# Patient Record
Sex: Female | Born: 1935
Health system: Southern US, Community
[De-identification: ages and names within clinical notes are randomized; demographics above are authoritative.]

## PROBLEM LIST (undated history)

## (undated) DIAGNOSIS — Z9889 Other specified postprocedural states: Secondary | ICD-10-CM

## (undated) DIAGNOSIS — Z8709 Personal history of other diseases of the respiratory system: Secondary | ICD-10-CM

## (undated) DIAGNOSIS — J45909 Unspecified asthma, uncomplicated: Secondary | ICD-10-CM

## (undated) DIAGNOSIS — K5790 Diverticulosis of intestine, part unspecified, without perforation or abscess without bleeding: Secondary | ICD-10-CM

## (undated) DIAGNOSIS — R112 Nausea with vomiting, unspecified: Secondary | ICD-10-CM

## (undated) DIAGNOSIS — K219 Gastro-esophageal reflux disease without esophagitis: Secondary | ICD-10-CM

## (undated) DIAGNOSIS — J189 Pneumonia, unspecified organism: Secondary | ICD-10-CM

## (undated) DIAGNOSIS — M5430 Sciatica, unspecified side: Secondary | ICD-10-CM

## (undated) DIAGNOSIS — M069 Rheumatoid arthritis, unspecified: Secondary | ICD-10-CM

## (undated) DIAGNOSIS — Z8719 Personal history of other diseases of the digestive system: Secondary | ICD-10-CM

## (undated) HISTORY — PX: BREAST BIOPSY: SHX20

## (undated) HISTORY — PX: ABDOMINAL HYSTERECTOMY: SHX81

## (undated) HISTORY — PX: HERNIA REPAIR: SHX51

## (undated) HISTORY — PX: TONSILLECTOMY: SUR1361

## (undated) HISTORY — PX: CATARACT EXTRACTION: SUR2

---

## 1997-11-29 ENCOUNTER — Ambulatory Visit (HOSPITAL_COMMUNITY): Admission: RE | Admit: 1997-11-29 | Discharge: 1997-11-29 | Payer: Self-pay | Admitting: *Deleted

## 1999-08-24 ENCOUNTER — Encounter: Payer: Self-pay | Admitting: General Surgery

## 1999-08-25 ENCOUNTER — Ambulatory Visit (HOSPITAL_COMMUNITY): Admission: RE | Admit: 1999-08-25 | Discharge: 1999-08-25 | Payer: Self-pay | Admitting: General Surgery

## 1999-09-26 ENCOUNTER — Encounter: Admission: RE | Admit: 1999-09-26 | Discharge: 1999-09-26 | Payer: Self-pay | Admitting: *Deleted

## 1999-09-26 ENCOUNTER — Encounter: Payer: Self-pay | Admitting: *Deleted

## 2000-05-27 ENCOUNTER — Encounter: Admission: RE | Admit: 2000-05-27 | Discharge: 2000-05-27 | Payer: Self-pay | Admitting: Internal Medicine

## 2000-05-27 ENCOUNTER — Encounter: Payer: Self-pay | Admitting: Internal Medicine

## 2000-06-17 ENCOUNTER — Encounter: Payer: Self-pay | Admitting: Internal Medicine

## 2000-06-17 ENCOUNTER — Ambulatory Visit (HOSPITAL_COMMUNITY): Admission: RE | Admit: 2000-06-17 | Discharge: 2000-06-17 | Payer: Self-pay | Admitting: Internal Medicine

## 2003-06-21 ENCOUNTER — Encounter: Admission: RE | Admit: 2003-06-21 | Discharge: 2003-06-21 | Payer: Self-pay | Admitting: Internal Medicine

## 2005-07-30 ENCOUNTER — Encounter: Admission: RE | Admit: 2005-07-30 | Discharge: 2005-07-30 | Payer: Self-pay

## 2008-08-11 ENCOUNTER — Encounter: Admission: RE | Admit: 2008-08-11 | Discharge: 2008-08-11 | Payer: Self-pay | Admitting: Family Medicine

## 2009-08-12 ENCOUNTER — Ambulatory Visit (HOSPITAL_COMMUNITY): Admission: RE | Admit: 2009-08-12 | Discharge: 2009-08-12 | Payer: Self-pay | Admitting: Family Medicine

## 2010-03-12 ENCOUNTER — Encounter: Payer: Self-pay | Admitting: Internal Medicine

## 2010-07-04 ENCOUNTER — Other Ambulatory Visit: Payer: Self-pay | Admitting: Family Medicine

## 2010-07-04 DIAGNOSIS — Z780 Asymptomatic menopausal state: Secondary | ICD-10-CM

## 2010-07-04 DIAGNOSIS — Z1231 Encounter for screening mammogram for malignant neoplasm of breast: Secondary | ICD-10-CM

## 2010-07-07 NOTE — Op Note (Signed)
[facility]  Patient:    Cathy Martinez, Cathy Martinez                         MRN: [id number] Proc. Date: 08/25/99 Adm. Date:  [id number] Attending:  Tempie Donning CC:         Georg Ruddle. Viviann Spare, M.D.                           Operative Report  PREOPERATIVE DIAGNOSIS:  Left inguinal hernia.  POSTOPERATIVE DIAGNOSIS:  Left inguinal hernia.  OPERATION:  Repair of left inguinal hernia - indirect, with Prolene mesh plug and patch.  SURGEON:  Gita Kudo, M.D.  ANESTHESIA:  General  CLINICAL SUMMARY:  A 77 year old woman with enlarging somewhat painful bulge in her left groin and has physical examination showing left inguinal hernia.  OPERATIVE FINDINGS:  The patient has a medium sized left inguinal hernia that was easily reduced - indirect.  DESCRIPTION OF PROCEDURE:  Under satisfactory general anesthesia, having received 1.0 gm Ancef preoperative, the patients abdomen was prepped and draped in a standard fashion.  A transverse left lower abdominal incision made and carried down to and through external ring and external oblique.  Bleeders coagulated or tied with 3-0 Vicryl. Self retaining retractors gave excellent exposure and the hernia sac freed around to healthy margins, opened and then contents reduced.  The sac was closed with a 0 Prolene suture ligature and excess trimmed.  The floor of the canal was then opened from the pubis to the internal ring and finger dissection used to develop the preperitoneal space. A portion of a 3 x 6 inch piece of Prolene mesh was cut and tailored into a plug and then inserted into the defect.  It was tacked around the periphery times three from its edge to the edge of the fascial defect.  Then the floor was closed over this with a running suture of 0 Prolene completely closing the internal ring.  The ends of the suture were left long.  The remainder of the mesh was tailored into an oval to fit over the floor. It  was anchored at the repair with a previous suture and then tacked around the periphery with interrupted 0 Prolene to the internal oblique above and the inguinal ligament below.  Then the wound was lavaged with saline, infiltrated with Marcaine for postoperative analgesia and closed in layers. Running 2-0 Vicryl for external oblique, 2-0 Vicryl for deep fascia, 3-0 Vicryl for subcutaneous and Steri-Strips for skin. Sterile absorptive dressing was applied and the patient went to the recovery room from the operating room in good condition without complications. DD:  08/25/99 TD:  08/25/99 Job: [id number]

## 2010-08-14 ENCOUNTER — Ambulatory Visit
Admission: RE | Admit: 2010-08-14 | Discharge: 2010-08-14 | Disposition: A | Discharge status: Home / Self Care | Payer: Medicare HMO | Source: Ambulatory Visit | Attending: Family Medicine | Admitting: Family Medicine

## 2010-08-14 DIAGNOSIS — Z1231 Encounter for screening mammogram for malignant neoplasm of breast: Secondary | ICD-10-CM

## 2010-08-14 DIAGNOSIS — Z780 Asymptomatic menopausal state: Secondary | ICD-10-CM

## 2011-01-03 ENCOUNTER — Other Ambulatory Visit: Payer: Self-pay | Admitting: Family Medicine

## 2011-01-03 DIAGNOSIS — M549 Dorsalgia, unspecified: Secondary | ICD-10-CM

## 2011-01-04 ENCOUNTER — Ambulatory Visit
Admission: RE | Admit: 2011-01-04 | Discharge: 2011-01-04 | Disposition: A | Discharge status: Home / Self Care | Payer: Medicare HMO | Source: Ambulatory Visit | Attending: Family Medicine | Admitting: Family Medicine

## 2011-01-04 DIAGNOSIS — M549 Dorsalgia, unspecified: Secondary | ICD-10-CM

## 2011-07-26 ENCOUNTER — Other Ambulatory Visit (HOSPITAL_COMMUNITY): Payer: Self-pay | Admitting: Family Medicine

## 2011-07-26 DIAGNOSIS — Z1231 Encounter for screening mammogram for malignant neoplasm of breast: Secondary | ICD-10-CM

## 2011-08-20 ENCOUNTER — Ambulatory Visit (HOSPITAL_COMMUNITY)
Admission: RE | Admit: 2011-08-20 | Discharge: 2011-08-20 | Disposition: A | Discharge status: Home / Self Care | Payer: Medicare Other | Source: Ambulatory Visit | Attending: Family Medicine | Admitting: Family Medicine

## 2011-08-20 DIAGNOSIS — Z1231 Encounter for screening mammogram for malignant neoplasm of breast: Secondary | ICD-10-CM | POA: Insufficient documentation

## 2012-02-20 DIAGNOSIS — Z8719 Personal history of other diseases of the digestive system: Secondary | ICD-10-CM

## 2012-02-20 HISTORY — DX: Personal history of other diseases of the digestive system: Z87.19

## 2012-07-28 ENCOUNTER — Other Ambulatory Visit (HOSPITAL_COMMUNITY): Payer: Self-pay | Admitting: Family Medicine

## 2012-07-28 DIAGNOSIS — Z1231 Encounter for screening mammogram for malignant neoplasm of breast: Secondary | ICD-10-CM

## 2012-08-20 ENCOUNTER — Ambulatory Visit (HOSPITAL_COMMUNITY)
Admission: RE | Admit: 2012-08-20 | Discharge: 2012-08-20 | Disposition: A | Discharge status: Home / Self Care | Payer: Medicare Other | Source: Ambulatory Visit | Attending: Family Medicine | Admitting: Family Medicine

## 2012-08-20 DIAGNOSIS — Z1231 Encounter for screening mammogram for malignant neoplasm of breast: Secondary | ICD-10-CM | POA: Insufficient documentation

## 2013-02-01 ENCOUNTER — Emergency Department (HOSPITAL_COMMUNITY): Payer: Medicare Other

## 2013-02-01 ENCOUNTER — Encounter (HOSPITAL_COMMUNITY): Payer: Self-pay | Admitting: Emergency Medicine

## 2013-02-01 ENCOUNTER — Inpatient Hospital Stay (HOSPITAL_COMMUNITY)
Admission: EM | Admit: 2013-02-01 | Discharge: 2013-02-13 | DRG: 390 | Disposition: A | Discharge status: Home Health | Payer: Medicare Other | Attending: General Surgery | Admitting: General Surgery

## 2013-02-01 DIAGNOSIS — K219 Gastro-esophageal reflux disease without esophagitis: Secondary | ICD-10-CM | POA: Diagnosis present

## 2013-02-01 DIAGNOSIS — K56609 Unspecified intestinal obstruction, unspecified as to partial versus complete obstruction: Secondary | ICD-10-CM

## 2013-02-01 DIAGNOSIS — J45909 Unspecified asthma, uncomplicated: Secondary | ICD-10-CM | POA: Diagnosis present

## 2013-02-01 DIAGNOSIS — K314 Gastric diverticulum: Secondary | ICD-10-CM | POA: Diagnosis present

## 2013-02-01 DIAGNOSIS — K5730 Diverticulosis of large intestine without perforation or abscess without bleeding: Secondary | ICD-10-CM | POA: Diagnosis present

## 2013-02-01 DIAGNOSIS — K224 Dyskinesia of esophagus: Secondary | ICD-10-CM | POA: Diagnosis present

## 2013-02-01 DIAGNOSIS — K565 Intestinal adhesions [bands], unspecified as to partial versus complete obstruction: Principal | ICD-10-CM | POA: Diagnosis present

## 2013-02-01 DIAGNOSIS — Z79899 Other long term (current) drug therapy: Secondary | ICD-10-CM

## 2013-02-01 HISTORY — DX: Unspecified asthma, uncomplicated: J45.909

## 2013-02-01 LAB — COMPREHENSIVE METABOLIC PANEL
AST: 12 U/L (ref 0–37)
Albumin: 3.7 g/dL (ref 3.5–5.2)
Alkaline Phosphatase: 56 U/L (ref 39–117)
BUN: 30 mg/dL — ABNORMAL HIGH (ref 6–23)
Chloride: 102 mEq/L (ref 96–112)
GFR calc non Af Amer: 84 mL/min — ABNORMAL LOW (ref >90)
Potassium: 3.8 mEq/L (ref 3.5–5.1)

## 2013-02-01 LAB — CBC WITH DIFFERENTIAL/PLATELET
Eosinophils Relative: 0 % (ref 0–5)
Hemoglobin: 15.3 g/dL — ABNORMAL HIGH (ref 12.0–15.0)
Lymphs Abs: 1.9 10*3/uL (ref 0.7–4.0)
MCH: 29.5 pg (ref 26.0–34.0)
MCHC: 32.9 g/dL (ref 30.0–36.0)
MCV: 89.6 fL (ref 78.0–100.0)
Neutrophils Relative %: 68 % (ref 43–77)
RBC: 5.19 MIL/uL — ABNORMAL HIGH (ref 3.87–5.11)
WBC: 9.3 10*3/uL (ref 4.0–10.5)

## 2013-02-01 LAB — URINALYSIS, ROUTINE W REFLEX MICROSCOPIC
Bilirubin Urine: NEGATIVE
Glucose, UA: NEGATIVE mg/dL
Ketones, ur: >80 mg/dL — AB
Nitrite: NEGATIVE
Protein, ur: NEGATIVE mg/dL
Urobilinogen, UA: 0.2 mg/dL (ref 0.0–1.0)
pH: 6 (ref 5.0–8.0)

## 2013-02-01 LAB — CG4 I-STAT (LACTIC ACID): Lactic Acid, Venous: 1.45 mmol/L (ref 0.5–2.2)

## 2013-02-01 LAB — LIPASE, BLOOD: Lipase: 19 U/L (ref 11–59)

## 2013-02-01 LAB — POCT I-STAT TROPONIN I

## 2013-02-01 MED ORDER — LORAZEPAM 2 MG/ML IJ SOLN
INTRAMUSCULAR | Status: AC
  Administered 2013-02-01: 0.5 mg via INTRAVENOUS
  Filled 2013-02-01: qty 1

## 2013-02-01 MED ORDER — BENZOCAINE 20 % MT SOLN
Freq: Once | OROMUCOSAL | Status: AC
  Administered 2013-02-01: 22:00:00 via OROMUCOSAL
  Filled 2013-02-01: qty 57

## 2013-02-01 MED ORDER — SODIUM CHLORIDE 0.9 % IV BOLUS (SEPSIS)
1000.0000 mL | Freq: Once | INTRAVENOUS | Status: AC
  Administered 2013-02-01: 1000 mL via INTRAVENOUS

## 2013-02-01 MED ORDER — LORAZEPAM 2 MG/ML IJ SOLN
0.5000 mg | Freq: Once | INTRAMUSCULAR | Status: AC
  Administered 2013-02-01: 0.5 mg via INTRAVENOUS

## 2013-02-01 MED ORDER — KCL IN DEXTROSE-NACL 20-5-0.45 MEQ/L-%-% IV SOLN
INTRAVENOUS | Status: DC
  Administered 2013-02-02 – 2013-02-06 (×9): via INTRAVENOUS
  Filled 2013-02-01 (×15): qty 1000

## 2013-02-01 MED ORDER — MORPHINE SULFATE 2 MG/ML IJ SOLN
2.0000 mg | INTRAMUSCULAR | Status: DC | PRN
  Administered 2013-02-02 – 2013-02-06 (×7): 2 mg via INTRAVENOUS
  Filled 2013-02-01 (×8): qty 1

## 2013-02-01 MED ORDER — ONDANSETRON HCL 4 MG/2ML IJ SOLN
4.0000 mg | Freq: Once | INTRAMUSCULAR | Status: AC
  Administered 2013-02-01: 4 mg via INTRAVENOUS
  Filled 2013-02-01: qty 2

## 2013-02-01 MED ORDER — PANTOPRAZOLE SODIUM 40 MG IV SOLR
40.0000 mg | Freq: Every day | INTRAVENOUS | Status: DC
  Administered 2013-02-02 – 2013-02-05 (×4): 40 mg via INTRAVENOUS
  Filled 2013-02-01 (×5): qty 40

## 2013-02-01 MED ORDER — ONDANSETRON HCL 4 MG/2ML IJ SOLN
4.0000 mg | Freq: Four times a day (QID) | INTRAMUSCULAR | Status: DC | PRN
  Administered 2013-02-02 – 2013-02-05 (×6): 4 mg via INTRAVENOUS
  Filled 2013-02-01 (×6): qty 2

## 2013-02-01 MED ORDER — LORAZEPAM 2 MG/ML IJ SOLN
0.5000 mg | Freq: Once | INTRAMUSCULAR | Status: AC
  Administered 2013-02-01: 0.5 mg via INTRAVENOUS
  Filled 2013-02-01: qty 1

## 2013-02-01 MED ORDER — IOHEXOL 300 MG/ML  SOLN: 25.0000 mL | INTRAMUSCULAR | Status: DC | PRN

## 2013-02-01 MED ORDER — ENOXAPARIN SODIUM 40 MG/0.4ML ~~LOC~~ SOLN
40.0000 mg | SUBCUTANEOUS | Status: DC
  Administered 2013-02-02 – 2013-02-13 (×12): 40 mg via SUBCUTANEOUS
  Filled 2013-02-01 (×15): qty 0.4

## 2013-02-01 MED ORDER — IOHEXOL 300 MG/ML  SOLN
100.0000 mL | Freq: Once | INTRAMUSCULAR | Status: AC | PRN
  Administered 2013-02-01: 100 mL via INTRAVENOUS

## 2013-02-01 MED ORDER — IOHEXOL 300 MG/ML  SOLN
20.0000 mL | INTRAMUSCULAR | Status: DC
  Administered 2013-02-01: 25 mL via ORAL

## 2013-02-01 MED ORDER — ALBUTEROL SULFATE HFA 108 (90 BASE) MCG/ACT IN AERS
1.0000 | INHALATION_SPRAY | Freq: Four times a day (QID) | RESPIRATORY_TRACT | Status: DC | PRN
  Filled 2013-02-01: qty 6.7

## 2013-02-01 NOTE — ED Provider Notes (Signed)
CSN: [id number]     Arrival date & time 02/01/13  1440 History   First MD Initiated Contact with Patient 02/01/13 1547     Chief Complaint  Patient presents with  . Emesis   (Consider location/radiation/quality/duration/timing/severity/associated sxs/prior Treatment) HPI  This is a 79 -year-old female who presents with vomiting. She has no past medical history. Patient reports onset of emesis and nausea on Thursday night. Patient reports nonbilious, nonbloody emesis. She denies any diarrhea. She does report diffuse abdominal pain which is worse with vomiting. She's continued to dry heaves today but has not had any vomiting. She states that she feels weak. She denies any syncope or dizziness.  Past Medical History  Diagnosis Date  . Asthma    History reviewed. No pertinent past surgical history. No family history on file. History  Substance Use Topics  . Smoking status: Never Smoker   . Smokeless tobacco: Not on file  . Alcohol Use: Yes   OB History   Grav Para Term Preterm Abortions TAB SAB Ect Mult Living                 Review of Systems  Constitutional: Negative for fever.  Respiratory: Negative for cough, chest tightness and shortness of breath.   Cardiovascular: Negative for chest pain.  Gastrointestinal: Positive for nausea, vomiting and abdominal pain.  Genitourinary: Negative for dysuria.  Musculoskeletal: Negative for back pain.  Skin: Negative for wound.  Neurological: Negative for headaches.  Psychiatric/Behavioral: Negative for confusion.  All other systems reviewed and are negative.    Allergies  Review of patient's allergies indicates no known allergies.  Home Medications   Current Outpatient Rx  Name  Route  Sig  Dispense  Refill  . albuterol (PROVENTIL HFA;VENTOLIN HFA) 108 (90 BASE) MCG/ACT inhaler   Inhalation   Inhale 1 puff into the lungs every 6 (six) hours as needed for wheezing or shortness of breath.         . Ascorbic Acid (VITAMIN C  GUMMIE PO)   Oral   Take 1 tablet by mouth daily.         . carboxymethylcellulose (REFRESH PLUS) 0.5 % SOLN   Both Eyes   Place 1 drop into both eyes 3 (three) times daily as needed (eye irritation).         Marland Kitchen docusate sodium (COLACE) 50 MG capsule   Oral   Take 50 mg by mouth daily as needed for mild constipation.         . Fish Oil OIL   Oral   Take 1 capsule by mouth daily.         . Multiple Vitamins-Minerals (MULTIVITAMIN WITH MINERALS) tablet   Oral   Take 1 tablet by mouth daily.          BP 177/68  Pulse 74  Temp(Src) 98.6 F (37 C) (Oral)  Resp 15  SpO2 98% Physical Exam  Nursing note and vitals reviewed. Constitutional: She is oriented to person, place, and time. No distress.  Elderly  HENT:  Head: Normocephalic and atraumatic.  Mucous membranes dry  Eyes: Pupils are equal, round, and reactive to light.  Neck: Neck supple.  Cardiovascular: Normal rate, regular rhythm and normal heart sounds.   No murmur heard. Pulmonary/Chest: Effort normal. No respiratory distress.  Abdominal: Soft. Bowel sounds are normal. There is tenderness. There is no rebound and no guarding.  Diffuse tenderness to palpation right side greater than left without rebound or guarding, midline inferior umbilical  well healed scar  Musculoskeletal: She exhibits no edema.  Neurological: She is alert and oriented to person, place, and time.  Skin: Skin is warm and dry.  Psychiatric: She has a normal mood and affect.    ED Course  Procedures (including critical care time) Labs Review Labs Reviewed  COMPREHENSIVE METABOLIC PANEL - Abnormal; Notable for the following:    Glucose, Bld 105 (*)    BUN 30 (*)    GFR calc non Af Amer 84 (*)    All other components within normal limits  CBC WITH DIFFERENTIAL - Abnormal; Notable for the following:    RBC 5.19 (*)    Hemoglobin 15.3 (*)    HCT 46.5 (*)    All other components within normal limits  URINALYSIS, ROUTINE W REFLEX  MICROSCOPIC - Abnormal; Notable for the following:    Ketones, ur >80 (*)    All other components within normal limits  LIPASE, BLOOD  CG4 I-STAT (LACTIC ACID)  POCT I-STAT TROPONIN I   Imaging Review Ct Abdomen Pelvis W Contrast  02/01/2013   CLINICAL DATA:  Nausea, vomiting and weakness.  Emesis.  EXAM: CT ABDOMEN AND PELVIS WITH CONTRAST  TECHNIQUE: Multidetector CT imaging of the abdomen and pelvis was performed using the standard protocol following bolus administration of intravenous contrast.  CONTRAST:  OMNIPAQUE IOHEXOL 300 MG/ML  SOLN  COMPARISON:  None.  FINDINGS: Lung Bases: Dependent atelectasis.  Liver: Mild intrahepatic biliary ductal dilation is present. No focal mass lesion.  Spleen:  Normal.  Gallbladder: Distended. No inflammatory changes. No calcified stones.  Common bile duct:  Normal common bile duct diameter.  Pancreas:  Mild prominence of pancreatic duct.  No mass lesion.  Adrenal glands:  Normal bilaterally.  Kidneys: Right renal sinus cyst. Normal enhancement and excretion. Smaller cysts are present in the right kidney. Both ureters appear normal.  Stomach:  Normal.  Small bowel: Dilated small bowel is present extending into the anatomic pelvis. There is fecalization of small bowel in the left lower quadrant. There is focal transition point identified in the left mid abdomen open (22 series 5). Fecalization is present proximal to this. No discrete mass lesion is identified however there is focal narrowing at the transition point. This may represent adhesive change or stricture.  Colon: Diverticulosis. Moderate stool burden. No colonic inflammatory changes.  Pelvic Genitourinary: Urinary bladder appears normal. Small amount of free fluid in the anatomic pelvis. Hysterectomy. No pelvic adenopathy identified.  Bones: No aggressive osseous lesions. Lumbar spondylosis and facet arthrosis.  Vasculature: Atherosclerosis without aneurysm. No acute abnormality.  Body Wall: Normal.   IMPRESSION: Small-bowel obstruction with focal transition point in the left mid abdomen, likely involving jejunum. Differential considerations are stricture versus is adhesive band. No discrete mass lesion or adenopathy. Small amount of reactive free fluid.  Mild intrahepatic biliary ductal dilation without an obstructing lesion identified. Correlation with bilirubin recommended. If normal, no further evaluation warranted. This recommendation follows ACR consensus guidelines: White Paper of the ACR Incidental Findings Committee II on Gallbladder and Biliary Findings. J Am Coll Radiol 2013:;10:953-956.   Electronically Signed   By: Andreas Newport M.D.   On: 02/01/2013 20:02   Dg Chest Portable 1 View  02/01/2013   CLINICAL DATA:  Vomiting  EXAM: PORTABLE CHEST - 1 VIEW  COMPARISON:  None.  FINDINGS: The heart size and mediastinal contours are within normal limits. Both lungs are clear. The visualized skeletal structures are unremarkable.  IMPRESSION: No active disease.  Electronically Signed   By: Marlan Palau M.D.   On: 02/01/2013 16:36    EKG Interpretation    Date/Time:  Sunday February 01 2013 16:15:28 EST Ventricular Rate:  76 PR Interval:  118 QRS Duration: 83 QT Interval:  364 QTC Calculation: 409 R Axis:   19 Text Interpretation:  Sinus rhythm Borderline short PR interval Confirmed by Camauri Craton  MD, Crystie Yanko ([id number]) on 02/01/2013 4:32:10 PM            MDM   1. SBO (small bowel obstruction)      Patient presents with isolated vomiting and appears to have some right-sided abdominal pain. She is nontoxic-appearing and her vital signs are within normal limits. Patient was given Zofran for nausea. Basic lab work including lactate was obtained and is within normal limits. Patient was given fluids. Given patient's age and tenderness on exam, patient was sent for CT scan. She was unable to tolerate by mouth contrast.  CT scan with SBO.  Surgery consulted for admission.  Patient  given ativan for NG tube placement.   Shon Baton, MD 02/01/13 2223

## 2013-02-01 NOTE — H&P (Signed)
Cathy Martinez is an 79 y.o. female.   Chief Complaint: Abdominal distention, nausea, vomiting HPI: This is a 79 yo female presents with four day history of abdominal distention, nausea, and vomiting.  She last had a normal bowel movement on Tuesday (six days ago).  Her abdomen has become fairly firm, and she has been vomiting up undigested food.  No hematemesis.  She was unable to tolerate even the oral contrast for the CT scan.  She reports that she passed a small "rabbit pellet" of stool yesterday.  Past Medical History  Diagnosis Date  . Asthma     PSH:  Abdominal hysterectomy 40 years ago   Left inguinal hernia repair with mesh - 2001 - Dr. Jerelene Redden  No family history on file. Social History:  reports that she has never smoked. She does not have any smokeless tobacco history on file. She reports that she drinks alcohol. Her drug history is not on file.  Allergies: No Known Allergies  Prior to Admission medications   Medication Sig Start Date End Date Taking? Authorizing Provider  albuterol (PROVENTIL HFA;VENTOLIN HFA) 108 (90 BASE) MCG/ACT inhaler Inhale 1 puff into the lungs every 6 (six) hours as needed for wheezing or shortness of breath.   Yes Historical Provider, MD  Ascorbic Acid (VITAMIN C GUMMIE PO) Take 1 tablet by mouth daily.   Yes Historical Provider, MD  carboxymethylcellulose (REFRESH PLUS) 0.5 % SOLN Place 1 drop into both eyes 3 (three) times daily as needed (eye irritation).   Yes Historical Provider, MD  docusate sodium (COLACE) 50 MG capsule Take 50 mg by mouth daily as needed for mild constipation.   Yes Historical Provider, MD  Fish Oil OIL Take 1 capsule by mouth daily.   Yes Historical Provider, MD  Multiple Vitamins-Minerals (MULTIVITAMIN WITH MINERALS) tablet Take 1 tablet by mouth daily.   Yes Historical Provider, MD    Results for orders placed during the hospital encounter of 02/01/13 (from the past 48 hour(s))  COMPREHENSIVE METABOLIC PANEL      Status: Abnormal   Collection Time    02/01/13  2:50 PM      Result Value Range   Sodium 138  135 - 145 mEq/L   Potassium 3.8  3.5 - 5.1 mEq/L   Chloride 102  96 - 112 mEq/L   CO2 21  19 - 32 mEq/L   Glucose, Bld 105 (*) 70 - 99 mg/dL   BUN 30 (*) 6 - 23 mg/dL   Creatinine, Ser 8.65  0.50 - 1.10 mg/dL   Calcium 9.1  8.4 - 78.4 mg/dL   Total Protein 6.9  6.0 - 8.3 g/dL   Albumin 3.7  3.5 - 5.2 g/dL   AST 12  0 - 37 U/L   ALT 8  0 - 35 U/L   Alkaline Phosphatase 56  39 - 117 U/L   Total Bilirubin 0.6  0.3 - 1.2 mg/dL   GFR calc non Af Amer 84 (*) >90 mL/min   GFR calc Af Amer >90  >90 mL/min   Comment: (NOTE)     The eGFR has been calculated using the CKD EPI equation.     This calculation has not been validated in all clinical situations.     eGFR's persistently <90 mL/min signify possible Chronic Kidney     Disease.  CBC WITH DIFFERENTIAL     Status: Abnormal   Collection Time    02/01/13  2:50 PM  Result Value Range   WBC 9.3  4.0 - 10.5 K/uL   RBC 5.19 (*) 3.87 - 5.11 MIL/uL   Hemoglobin 15.3 (*) 12.0 - 15.0 g/dL   HCT 16.1 (*) 09.6 - 04.5 %   MCV 89.6  78.0 - 100.0 fL   MCH 29.5  26.0 - 34.0 pg   MCHC 32.9  30.0 - 36.0 g/dL   RDW 40.9  81.1 - 91.4 %   Platelets 294  150 - 400 K/uL   Neutrophils Relative % 68  43 - 77 %   Neutro Abs 6.4  1.7 - 7.7 K/uL   Lymphocytes Relative 21  12 - 46 %   Lymphs Abs 1.9  0.7 - 4.0 K/uL   Monocytes Relative 11  3 - 12 %   Monocytes Absolute 1.0  0.1 - 1.0 K/uL   Eosinophils Relative 0  0 - 5 %   Eosinophils Absolute 0.0  0.0 - 0.7 K/uL   Basophils Relative 0  0 - 1 %   Basophils Absolute 0.0  0.0 - 0.1 K/uL  LIPASE, BLOOD     Status: None   Collection Time    02/01/13  2:50 PM      Result Value Range   Lipase 19  11 - 59 U/L  POCT I-STAT TROPONIN I     Status: None   Collection Time    02/01/13  4:51 PM      Result Value Range   Troponin i, poc 0.01  0.00 - 0.08 ng/mL   Comment 3            Comment: Due to the release  kinetics of cTnI,     a negative result within the first hours     of the onset of symptoms does not rule out     myocardial infarction with certainty.     If myocardial infarction is still suspected,     repeat the test at appropriate intervals.  CG4 I-STAT (LACTIC ACID)     Status: None   Collection Time    02/01/13  4:53 PM      Result Value Range   Lactic Acid, Venous 1.45  0.5 - 2.2 mmol/L  URINALYSIS, ROUTINE W REFLEX MICROSCOPIC     Status: Abnormal   Collection Time    02/01/13  7:49 PM      Result Value Range   Color, Urine YELLOW  YELLOW   APPearance CLEAR  CLEAR   Specific Gravity, Urine 1.015  1.005 - 1.030   pH 6.0  5.0 - 8.0   Glucose, UA NEGATIVE  NEGATIVE mg/dL   Hgb urine dipstick NEGATIVE  NEGATIVE   Bilirubin Urine NEGATIVE  NEGATIVE   Ketones, ur >80 (*) NEGATIVE mg/dL   Protein, ur NEGATIVE  NEGATIVE mg/dL   Urobilinogen, UA 0.2  0.0 - 1.0 mg/dL   Nitrite NEGATIVE  NEGATIVE   Leukocytes, UA NEGATIVE  NEGATIVE   Comment: MICROSCOPIC NOT DONE ON URINES WITH NEGATIVE PROTEIN, BLOOD, LEUKOCYTES, NITRITE, OR GLUCOSE <1000 mg/dL.   Ct Abdomen Pelvis W Contrast  02/01/2013   CLINICAL DATA:  Nausea, vomiting and weakness.  Emesis.  EXAM: CT ABDOMEN AND PELVIS WITH CONTRAST  TECHNIQUE: Multidetector CT imaging of the abdomen and pelvis was performed using the standard protocol following bolus administration of intravenous contrast.  CONTRAST:  OMNIPAQUE IOHEXOL 300 MG/ML  SOLN  COMPARISON:  None.  FINDINGS: Lung Bases: Dependent atelectasis.  Liver: Mild intrahepatic biliary ductal dilation is  present. No focal mass lesion.  Spleen:  Normal.  Gallbladder: Distended. No inflammatory changes. No calcified stones.  Common bile duct:  Normal common bile duct diameter.  Pancreas:  Mild prominence of pancreatic duct.  No mass lesion.  Adrenal glands:  Normal bilaterally.  Kidneys: Right renal sinus cyst. Normal enhancement and excretion. Smaller cysts are present in the  right kidney. Both ureters appear normal.  Stomach:  Normal.  Small bowel: Dilated small bowel is present extending into the anatomic pelvis. There is fecalization of small bowel in the left lower quadrant. There is focal transition point identified in the left mid abdomen open (22 series 5). Fecalization is present proximal to this. No discrete mass lesion is identified however there is focal narrowing at the transition point. This may represent adhesive change or stricture.  Colon: Diverticulosis. Moderate stool burden. No colonic inflammatory changes.  Pelvic Genitourinary: Urinary bladder appears normal. Small amount of free fluid in the anatomic pelvis. Hysterectomy. No pelvic adenopathy identified.  Bones: No aggressive osseous lesions. Lumbar spondylosis and facet arthrosis.  Vasculature: Atherosclerosis without aneurysm. No acute abnormality.  Body Wall: Normal.  IMPRESSION: Small-bowel obstruction with focal transition point in the left mid abdomen, likely involving jejunum. Differential considerations are stricture versus is adhesive band. No discrete mass lesion or adenopathy. Small amount of reactive free fluid.  Mild intrahepatic biliary ductal dilation without an obstructing lesion identified. Correlation with bilirubin recommended. If normal, no further evaluation warranted. This recommendation follows ACR consensus guidelines: White Paper of the ACR Incidental Findings Committee II on Gallbladder and Biliary Findings. J Am Coll Radiol 2013:;10:953-956.   Electronically Signed   By: Andreas Newport M.D.   On: 02/01/2013 20:02   Dg Chest Portable 1 View  02/01/2013   CLINICAL DATA:  Vomiting  EXAM: PORTABLE CHEST - 1 VIEW  COMPARISON:  None.  FINDINGS: The heart size and mediastinal contours are within normal limits. Both lungs are clear. The visualized skeletal structures are unremarkable.  IMPRESSION: No active disease.   Electronically Signed   By: Marlan Palau M.D.   On: 02/01/2013 16:36     Review of Systems  Constitutional: Negative for weight loss.  HENT: Negative for ear discharge, ear pain, hearing loss and tinnitus.   Eyes: Negative.  Negative for blurred vision, double vision, photophobia and pain.  Respiratory: Negative for cough, sputum production and shortness of breath.   Cardiovascular: Negative for chest pain.  Gastrointestinal: Positive for nausea, vomiting and abdominal pain.  Genitourinary: Negative for dysuria, urgency, frequency and flank pain.  Musculoskeletal: Negative for back pain, falls, joint pain, myalgias and neck pain.  Neurological: Negative.  Negative for dizziness, tingling, sensory change, focal weakness, loss of consciousness and headaches.  Endo/Heme/Allergies: Negative.  Does not bruise/bleed easily.  Psychiatric/Behavioral: Negative for depression, memory loss and substance abuse. The patient is not nervous/anxious.     Blood pressure 177/68, pulse 74, temperature 98.6 F (37 C), temperature source Oral, resp. rate 15, SpO2 98.00%. Physical Exam  Elderly WDWN in NAD HEENT:  EOMI, sclera anicteric Neck:  No masses, no thyromegaly Lungs:  CTA bilaterally; normal respiratory effort CV:  Regular rate and rhythm; no murmurs Abd:  + tinkling bowel sounds; mildly distended; diffuse "soreness"; no palpable masses Healed lower midline incision and left groin incision Ext:  Well-perfused; no edema Skin:  Warm, dry; no sign of jaundice  Assessment/Plan Small bowel obstruction - likely secondary to adhesions from hysterectomy; with fecalization of the proximal small bowel.  Will  admit for IV hydration, bowel rest, NG suction.  If this does not help resolve her obstruction, she may need exploration to lyse the adhesions.  I explained this to the patient and her family.  Cassady Turano K. 02/01/2013, 9:09 PM

## 2013-02-01 NOTE — ED Notes (Signed)
Vomiting since Thursday and now feels weak

## 2013-02-01 NOTE — ED Notes (Addendum)
Called CT to inform pt unable to tolerate omnipaque solution. Pt becoming nauseas and vomiting.

## 2013-02-01 NOTE — ED Notes (Signed)
Patient transported to CT 

## 2013-02-02 ENCOUNTER — Inpatient Hospital Stay (HOSPITAL_COMMUNITY): Payer: Medicare Other

## 2013-02-02 MED ORDER — MENTHOL 3 MG MT LOZG
1.0000 | LOZENGE | OROMUCOSAL | Status: DC | PRN
  Filled 2013-02-02: qty 9

## 2013-02-02 MED ORDER — PHENOL 1.4 % MT LIQD
1.0000 | OROMUCOSAL | Status: DC | PRN
  Administered 2013-02-02: 1 via OROMUCOSAL
  Filled 2013-02-02: qty 177

## 2013-02-02 MED ORDER — WHITE PETROLATUM GEL
Status: AC
  Filled 2013-02-02: qty 5

## 2013-02-02 NOTE — Progress Notes (Signed)
Films look like PSBO, some BS. Continue NGT today and we will see how she does. I spoke to her family. Patient examined and I agree with the assessment and plan  Violeta Gelinas, MD, MPH, FACS Pager: [phone]  02/02/2013 9:01 AM

## 2013-02-02 NOTE — Progress Notes (Signed)
Subjective: out NG so far.  Vomited a lot before NG was placed.  Only in canister with light green and mucousy output.  Pt's pain improved, but not resolved.  Still tender in central abdomen.  Hasn't been OOB yet.  Family at bedside.  Asking for throat spray.  Objective: Vital signs in last 24 hours: Temp:  [98.3 F (36.8 C)-98.6 F (37 C)] 98.3 F (36.8 C) (12/15 0500) Pulse Rate:  [72-92] 77 (12/15 0500) Resp:  [15-31] 20 (12/15 0500) BP: (117-180)/(56-86) 139/56 mmHg (12/15 0500) SpO2:  [93 %-99 %] 97 % (12/15 0500) Weight:  [116 lb 10 oz (52.9 kg)] 116 lb 10 oz (52.9 kg) (12/14 2320) Last BM Date: 01/27/13  Intake/Output from previous day: 12/14 0701 - 12/15 0700 In: -  Out: 250 [Emesis/NG output:250] Intake/Output this shift:    PE: Gen:  Alert, NAD, pleasant Abd: Soft, moderate tenderness in central abdomen, mild distension, few BS, no HSM  Lab Results:   Recent Labs  02/01/13 1450  WBC 9.3  HGB 15.3*  HCT 46.5*  PLT 294   BMET  Recent Labs  02/01/13 1450  NA 138  K 3.8  CL 102  CO2 21  GLUCOSE 105*  BUN 30*  CREATININE 0.62  CALCIUM 9.1   PT/INR No results found for this basename: LABPROT, INR,  in the last 72 hours CMP     Component Value Date/Time   NA 138 02/01/2013 1450   K 3.8 02/01/2013 1450   CL 102 02/01/2013 1450   CO2 21 02/01/2013 1450   GLUCOSE 105* 02/01/2013 1450   BUN 30* 02/01/2013 1450   CREATININE 0.62 02/01/2013 1450   CALCIUM 9.1 02/01/2013 1450   PROT 6.9 02/01/2013 1450   ALBUMIN 3.7 02/01/2013 1450   AST 12 02/01/2013 1450   ALT 8 02/01/2013 1450   ALKPHOS 56 02/01/2013 1450   BILITOT 0.6 02/01/2013 1450   GFRNONAA 84* 02/01/2013 1450   GFRAA >90 02/01/2013 1450   Lipase     Component Value Date/Time   LIPASE 19 02/01/2013 1450       Studies/Results: Ct Abdomen Pelvis W Contrast  02/01/2013   CLINICAL DATA:  Nausea, vomiting and weakness.  Emesis.  EXAM: CT ABDOMEN AND PELVIS WITH  CONTRAST  TECHNIQUE: Multidetector CT imaging of the abdomen and pelvis was performed using the standard protocol following bolus administration of intravenous contrast.  CONTRAST:  OMNIPAQUE IOHEXOL 300 MG/ML  SOLN  COMPARISON:  None.  FINDINGS: Lung Bases: Dependent atelectasis.  Liver: Mild intrahepatic biliary ductal dilation is present. No focal mass lesion.  Spleen:  Normal.  Gallbladder: Distended. No inflammatory changes. No calcified stones.  Common bile duct:  Normal common bile duct diameter.  Pancreas:  Mild prominence of pancreatic duct.  No mass lesion.  Adrenal glands:  Normal bilaterally.  Kidneys: Right renal sinus cyst. Normal enhancement and excretion. Smaller cysts are present in the right kidney. Both ureters appear normal.  Stomach:  Normal.  Small bowel: Dilated small bowel is present extending into the anatomic pelvis. There is fecalization of small bowel in the left lower quadrant. There is focal transition point identified in the left mid abdomen open (22 series 5). Fecalization is present proximal to this. No discrete mass lesion is identified however there is focal narrowing at the transition point. This may represent adhesive change or stricture.  Colon: Diverticulosis. Moderate stool burden. No colonic inflammatory changes.  Pelvic Genitourinary: Urinary bladder appears normal. Small amount of  free fluid in the anatomic pelvis. Hysterectomy. No pelvic adenopathy identified.  Bones: No aggressive osseous lesions. Lumbar spondylosis and facet arthrosis.  Vasculature: Atherosclerosis without aneurysm. No acute abnormality.  Body Wall: Normal.  IMPRESSION: Small-bowel obstruction with focal transition point in the left mid abdomen, likely involving jejunum. Differential considerations are stricture versus is adhesive band. No discrete mass lesion or adenopathy. Small amount of reactive free fluid.  Mild intrahepatic biliary ductal dilation without an obstructing lesion identified.  Correlation with bilirubin recommended. If normal, no further evaluation warranted. This recommendation follows ACR consensus guidelines: White Paper of the ACR Incidental Findings Committee II on Gallbladder and Biliary Findings. J Am Coll Radiol 2013:;10:953-956.   Electronically Signed   By: Andreas Newport M.D.   On: 02/01/2013 20:02   Dg Chest Portable 1 View  02/01/2013   CLINICAL DATA:  Vomiting  EXAM: PORTABLE CHEST - 1 VIEW  COMPARISON:  None.  FINDINGS: The heart size and mediastinal contours are within normal limits. Both lungs are clear. The visualized skeletal structures are unremarkable.  IMPRESSION: No active disease.   Electronically Signed   By: Marlan Palau M.D.   On: 02/01/2013 16:36    Anti-infectives: Anti-infectives   None       Assessment/Plan SBO - likely 2* adhesions from hysterectomy; fecalization of the proximal small bowel  Plan: 1.  Conservative treatment with IVF, bowel rest/NPO, NG tube, pain control, antiemetics 2.  If conservative measure do not resolve her obstruction may need Ex lap with LOA in next few days 3.  Ambulate and IS 4.  SCD's and lovenox 5.  Repeat BMET in am    LOS: 1 day    DORT, Aundra Millet 02/02/2013, 7:36 AM Pager: [phone]

## 2013-02-02 NOTE — Evaluation (Signed)
Physical Therapy Evaluation Patient Details Name: Cathy Martinez MRN: [id number] DOB: 1931-08-29 Today's Date: 02/02/2013 Time: 1500-1520 PT Time Calculation (min): 20 min  PT Assessment / Plan / Recommendation History of Present Illness  Pt is a 79 y.o. female who lives alone and was adm secondary to small bowel obstruction.   Clinical Impression  Pt adm due to the above. Presents with decreased independence with mobility due to balance deficits, increased pain, decreased strength, and overall decreased activity tolerance. Pt to benefit from skilled PT to address deficits listed and return home, where she lives alone. Pt is agreeable to SNF at this time, hopes for [facility]. Pt would prefer to go home and was very independent prior to admission. It is uncertain the amount of 24/7 (A) pt will have. Will plan to attempt least restrictive AD to increase stability with gt.     PT Assessment  Patient needs continued PT services    Follow Up Recommendations  SNF;Supervision/Assistance - 24 hour;Other (comment) (pt is agreeable to SNF but hopes to D/C home )    Does the patient have the potential to tolerate intense rehabilitation      Barriers to Discharge Inaccessible home environment;Decreased caregiver support      Equipment Recommendations  Rolling walker with 5" wheels;3in1 (PT)    Recommendations for Other Services OT consult   Frequency Min 3X/week    Precautions / Restrictions Precautions Precautions: Fall Precaution Comments: Pt c/o Rt knee pain; is scheduled to have Rt TKA next year (2015); pt has NG tube Restrictions Weight Bearing Restrictions: No   Pertinent Vitals/Pain C/o pain in abdomen 6/10 patient repositioned for comfort       Mobility  Bed Mobility Bed Mobility: Supine to Sit;Sitting - Scoot to Edge of Bed;Sit to Supine Supine to Sit: 4: Min assist;HOB elevated;With rails Sitting - Scoot to Edge of Bed: 5: Supervision Sit to Supine: 4: Min guard;HOB  flat Details for Bed Mobility Assistance: pt requires (A) due to generalized weakness in LEs and overall decreased activity tolerance; cues for hand placement and sequencing; encouraged pt to log roll to reduce pressure and pain in abdomen Transfers Transfers: Sit to Stand;Stand to Sit Sit to Stand: 3: Mod assist;From bed;With upper extremity assist Stand to Sit: 4: Min assist;To bed;With upper extremity assist Details for Transfer Assistance: pt with increased difficulty achieving upright standing position; pt requries (A) to maintain balance; pt with narrow BOS and leans posteriorly; cues for hand placement and safety  Ambulation/Gait Ambulation/Gait Assistance: 3: Mod assist Ambulation Distance (Feet): 18 Feet Assistive device: 1 person hand held assist Ambulation/Gait Assistance Details: pt required handheld (A) and (A) around waist to maintain balance; pt unsteady with gt and tends to loose balance to the Rt and/or posteriorly; cues for sequencing and safety; pt would benefit from RW to increase stability and decr risk of falls  Gait Pattern: Step-through pattern;Decreased stride length;Narrow base of support;Trunk flexed;Shuffle Gait velocity: very decreased and guarded General Gait Details: pt guarded due to abdomen pain and Rt knee pain  Stairs: No Wheelchair Mobility Wheelchair Mobility: No         PT Diagnosis: Abnormality of gait;Generalized weakness;Acute pain  PT Problem List: Decreased strength;Decreased activity tolerance;Decreased balance;Decreased mobility;Decreased knowledge of use of DME;Decreased safety awareness;Pain PT Treatment Interventions: DME instruction;Gait training;Functional mobility training;Stair training;Therapeutic activities;Therapeutic exercise;Balance training;Neuromuscular re-education;Patient/family education     PT Goals(Current goals can be found in the care plan section) Acute Rehab PT Goals Patient Stated Goal: to be more  independent  PT Goal  Formulation: With patient Time For Goal Achievement: 02/16/13 Potential to Achieve Goals: Good  Visit Information  Last PT Received On: 02/02/13 Assistance Needed: +1 History of Present Illness: Pt is a 79 y.o. female who lives alone and was adm secondary to small bowel obstruction.        Prior Functioning  Home Living Family/patient expects to be discharged to:: Private residence Living Arrangements: Alone Available Help at Discharge: Family;Friend(s);Available 24 hours/day Type of Home: House Home Access: Stairs to enter [employer] of Steps: 4 Entrance Stairs-Rails: Can reach both;Left;Right Home Layout: One level Home Equipment: Cane - single point;Grab bars - tub/shower Additional Comments: friends who were present in room; reported that pt would have 24/7 (A) as long as needed; it is unclear the level of (A) these people would be able to provide Prior Function Level of Independence: Independent with assistive device(s) Comments: recently began ambulating with a SPC secondary to Rt knee pain  Communication Communication: No difficulties    Cognition  Cognition Arousal/Alertness: Awake/alert Behavior During Therapy: WFL for tasks assessed/performed Overall Cognitive Status: Within Functional Limits for tasks assessed    Extremity/Trunk Assessment Upper Extremity Assessment Upper Extremity Assessment: Defer to OT evaluation Lower Extremity Assessment Lower Extremity Assessment: Generalized weakness (limited by pain ) Cervical / Trunk Assessment Cervical / Trunk Assessment: Normal   Balance Balance Balance Assessed: Yes Static Sitting Balance Static Sitting - Balance Support: Bilateral upper extremity supported;Feet supported Static Sitting - Level of Assistance: 5: Stand by assistance Static Sitting - Comment/# of Minutes: pt tolerated sitting EOB ~ 4 min  End of Session PT - End of Session Equipment Utilized During Treatment: Gait belt;Other (comment)  (NG tube) Activity Tolerance: Patient limited by pain;Patient limited by fatigue Patient left: with family/visitor present;in bed;with call bell/phone within reach Nurse Communication: Mobility status;Precautions  GP     Donell Sievert, [place] [phone] 02/02/2013, 3:51 PM

## 2013-02-02 NOTE — Progress Notes (Addendum)
Per pt's family request, all four side rails on bed are up.  Family was educated on difficulty and reasoning for not having all rails up, family voiced understanding.  All 4 rails on bed are currently engaged and up.

## 2013-02-03 ENCOUNTER — Inpatient Hospital Stay (HOSPITAL_COMMUNITY): Payer: Medicare Other

## 2013-02-03 LAB — BASIC METABOLIC PANEL
BUN: 13 mg/dL (ref 6–23)
Calcium: 8.2 mg/dL — ABNORMAL LOW (ref 8.4–10.5)
GFR calc Af Amer: >90 mL/min (ref >90)
GFR calc non Af Amer: 84 mL/min — ABNORMAL LOW (ref >90)
Potassium: 4.1 mEq/L (ref 3.5–5.1)
Sodium: 134 mEq/L — ABNORMAL LOW (ref 135–145)

## 2013-02-03 MED ORDER — BISACODYL 10 MG RE SUPP
10.0000 mg | Freq: Every day | RECTAL | Status: DC | PRN
  Administered 2013-02-06: 10 mg via RECTAL
  Filled 2013-02-03: qty 1

## 2013-02-03 MED ORDER — LORAZEPAM 2 MG/ML IJ SOLN
0.2500 mg | Freq: Two times a day (BID) | INTRAMUSCULAR | Status: DC | PRN
  Administered 2013-02-04 – 2013-02-07 (×3): 0.25 mg via INTRAVENOUS
  Filled 2013-02-03 (×3): qty 1

## 2013-02-03 NOTE — Evaluation (Signed)
Occupational Therapy Evaluation Patient Details Name: Cathy Martinez MRN: [id number] DOB: 1932/01/04 Today's Date: 02/03/2013 Time: 4098-1191 OT Time Calculation (min): 21 min  OT Assessment / Plan / Recommendation History of present illness Pt is a 79 y.o. female who lives alone and was adm secondary to small bowel obstruction.    Clinical Impression   Pt presents with below problem list. Pt will benefit from acute OT to increase independence prior to d/c.     OT Assessment  Patient needs continued OT Services    Follow Up Recommendations  SNF;Supervision/Assistance - 24 hour    Barriers to Discharge      Equipment Recommendations  3 in 1 bedside comode;Other (comment) (tub equipment tbd)    Recommendations for Other Services    Frequency  Min 2X/week    Precautions / Restrictions Precautions Precautions: Fall Precaution Comments: NG tube Restrictions Weight Bearing Restrictions: No   Pertinent Vitals/Pain Pain 4-4/10 in abdomen. Repositioned.     ADL  Eating/Feeding: Other (comment) (has NG tube) Grooming: Brushing hair;Wash/dry hands;Min guard;Set up;Supervision/safety (hands-Min guard and brushing hair-setup/supervision) Where Assessed - Grooming: Supported sitting;Supported standing;Unsupported standing Upper Body Bathing: Set up;Supervision/safety Where Assessed - Upper Body Bathing: Supported sitting Lower Body Bathing: Min guard Where Assessed - Lower Body Bathing: Supported sit to stand Upper Body Dressing: Set up;Supervision/safety Where Assessed - Upper Body Dressing: Supported sitting Lower Body Dressing: Min guard Where Assessed - Lower Body Dressing: Supported sit to Pharmacist, hospital: [employer]graph[employer] surveyor Method: Sit to Barista: Raised toilet seat with arms (or 3-in-1 over toilet) Toileting - Clothing Manipulation and Hygiene: Min guard Where Assessed - Toileting Clothing Manipulation and Hygiene: Sit to stand from  3-in-1 or toilet Tub/Shower Transfer Method: Not assessed Equipment Used: Gait belt;Rolling walker Transfers/Ambulation Related to ADLs: Min guard ADL Comments: Educated to sit to bathe and sit to get LB clothing on. Explained benefit of getting up and moving to increase activity tolerance.     OT Diagnosis: Generalized weakness;Acute pain  OT Problem List: Decreased strength;Decreased activity tolerance;Impaired balance (sitting and/or standing);Decreased knowledge of use of DME or AE;Decreased knowledge of precautions;Pain OT Treatment Interventions: Self-care/ADL training;Therapeutic exercise;DME and/or AE instruction;Therapeutic activities;Patient/family education;Balance training   OT Goals(Current goals can be found in the care plan section) Acute Rehab OT Goals Patient Stated Goal: to get back to doing what she was before OT Goal Formulation: With patient Time For Goal Achievement: 02/10/13 Potential to Achieve Goals: Good ADL Goals Pt Will Perform Grooming: standing;with supervision Pt Will Perform Lower Body Bathing: with supervision;sit to/from stand Pt Will Perform Lower Body Dressing: with supervision;sit to/from stand Pt Will Transfer to Toilet: ambulating;with supervision (3 in 1 over commode) Pt Will Perform Toileting - Clothing Manipulation and hygiene: with supervision;sit to/from stand  Visit Information  Last OT Received On: 02/03/13 Assistance Needed: +1 History of Present Illness: Pt is a 79 y.o. female who lives alone and was adm secondary to small bowel obstruction.        Prior Functioning     Home Living Family/patient expects to be discharged to:: Private residence Living Arrangements: Alone Available Help at Discharge: Family;Friend(s);Available 24 hours/day Type of Home: House Home Access: Stairs to enter [employer] of Steps: 4 Entrance Stairs-Rails: Can reach both;Left;Right Home Layout: One level Home Equipment: Cane - single  point;Grab bars - tub/shower Prior Function Level of Independence: Independent with assistive device(s) Comments: recently began ambulating with a SPC secondary to Rt knee pain  Communication  Communication: No difficulties         Vision/Perception     Cognition  Cognition Arousal/Alertness: Awake/alert Behavior During Therapy: WFL for tasks assessed/performed Overall Cognitive Status: Within Functional Limits for tasks assessed    Extremity/Trunk Assessment Upper Extremity Assessment Upper Extremity Assessment: RUE deficits/detail;LUE deficits/detail;Generalized weakness RUE Deficits / Details: pain in abdomen when raising both UE-raised approximately 90 degrees AROM shoulder flexion LUE Deficits / Details: pain in abdomen when raising both UE-raised approximately 90 degrees AROM shoulder flexion Lower Extremity Assessment Lower Extremity Assessment: Defer to PT evaluation     Mobility Bed Mobility Bed Mobility: Supine to Sit;Sitting - Scoot to Edge of Bed Supine to Sit: 4: Min assist;HOB elevated;With rails Sitting - Scoot to Edge of Bed: 4: Min guard Details for Bed Mobility Assistance: Cues for technique. Assistance with trunk. Transfers Transfers: Sit to Stand;Stand to Sit Sit to Stand: 4: Min guard;With upper extremity assist;From bed;From chair/3-in-1 Stand to Sit: 4: Min guard;With upper extremity assist;To chair/3-in-1 Details for Transfer Assistance: Min guard for safety. Cues for hand placement.     Exercise Other Exercises Other Exercises: educated on chair push ups as well as to be pumping ankles   Balance     End of Session OT - End of Session Equipment Utilized During Treatment: Gait belt;Rolling walker Activity Tolerance: Patient limited by pain;Patient limited by fatigue Patient left: in chair;Other (comment) (called nurse to give her call bell) Nurse Communication: Other (comment) (told tech she was up in chair and mobility status)  GO      Earlie Raveling OTR/L [phone] 02/03/2013, 4:13 PM

## 2013-02-03 NOTE — Clinical Social Work Psychosocial (Signed)
Clinical Social Work Department BRIEF PSYCHOSOCIAL ASSESSMENT 02/03/2013  Patient:  Cathy Martinez, Cathy Martinez     Account Number:  [id number]     Admit date:  02/01/2013  Clinical Social Worker:  Sherre Lain  Date/Time:  02/03/2013 04:10 PM  Referred by:  Physician  Date Referred:  02/03/2013 Referred for  SNF Placement   Other Referral:   none.   Interview type:  Patient Other interview type:   CSW also spoke to pt's son who was at bedside.    PSYCHOSOCIAL DATA Living Status:  ALONE Admitted from facility:   Level of care:   Primary support name:  Mel King Primary support relationship to patient:  CHILD, ADULT Degree of support available:   Strong support system, however, son lives in [place].    CURRENT CONCERNS Current Concerns  Post-Acute Placement   Other Concerns:   none.    SOCIAL WORK ASSESSMENT / PLAN CSW met with pt and pt's son at bedside. Pt stated that she was open to being placed in a SNF, only if it was [facility] since she lives in [place]. Pt discussed having a second option as a back-up, pt stated she was agreeable to [facility] search. Per pt's son, pt is from home alone, but has supportive neighbors who come and visit with pt. (Pt's neighbors actually came to visit while CSW was present at bedside). CSW to follow-up with pt and pt's son regarding SNF bed offers.   Assessment/plan status:  Psychosocial Support/Ongoing Assessment of Needs Other assessment/ plan:   none.   Information/referral to community resources:   [facility] bed offers.    PATIENTS/FAMILYS RESPONSE TO PLAN OF CARE: Pt and pt's son are understanding and agreeable to CSW plan of care.       Darlyn Chamber, LCSWA Clinical Social Worker [phone]

## 2013-02-03 NOTE — Clinical Social Work Placement (Signed)
Clinical Social Work Department CLINICAL SOCIAL WORK PLACEMENT NOTE 02/03/2013  Patient:  Cathy Martinez, Cathy Martinez  Account Number:  [id number] Admit date:  02/01/2013  Clinical Social Worker:  Irving Burton [place], LCSWA  Date/time:  02/03/2013 04:15 PM  Clinical Social Work is seeking post-discharge placement for this patient at the following level of care:   SKILLED NURSING   (*CSW will update this form in Epic as items are completed)   02/03/2013  Patient/family provided with [facility] Department of Clinical Social Works list of facilities offering this level of care within the geographic area requested by the patient (or if unable, by the patients family).  02/03/2013  Patient/family informed of their freedom to choose among providers that offer the needed level of care, that participate in Medicare, Medicaid or managed care program needed by the patient, have an available bed and are willing to accept the patient.  02/03/2013  Patient/family informed of [facility] ownership interest in [facility], as well as of the fact that they are under no obligation to receive care at this facility.  PASARR submitted to EDS on 02/03/2013 PASARR number received from EDS on 02/03/2013  FL2 transmitted to all facilities in geographic area requested by pt/family on  02/03/2013 FL2 transmitted to all facilities within larger geographic area on   Patient informed that his/her managed care company has contracts with or will negotiate with  certain facilities, including the following:     Patient/family informed of bed offers received:   Patient chooses bed at  Physician recommends and patient chooses bed at    Patient to be transferred to  on   Patient to be transferred to facility by   The following physician request were entered in Epic:   Additional Comments:  Darlyn Chamber, Theresia Majors Clinical Social Worker [phone]

## 2013-02-03 NOTE — Progress Notes (Signed)
Pt has been up in chair that rocks this morning to help with gas. Pt has had NGT clamped since around 9 am.  Pt reports she has been burping and is now having nausea. PRN Zofran given for nausea and NGT connected back to low intermittent wall suction per order at 1235. Pt seems to be more comfortable now.

## 2013-02-03 NOTE — Progress Notes (Signed)
Has passed some gas. Films improving. Clamp NGT. I spoke to her family. Patient examined and I agree with the assessment and plan  Violeta Gelinas, MD, MPH, FACS Pager: [phone]  02/03/2013 3:21 PM

## 2013-02-03 NOTE — Progress Notes (Signed)
Physical Therapy Treatment Patient Details Name: Cathy Martinez MRN: [id number] DOB: Aug 23, 1931 Today's Date: 02/03/2013 Time: 4098-1191 PT Time Calculation (min): 13 min  PT Assessment / Plan / Recommendation  History of Present Illness Pt is a 79 y.o. female who lives alone and was adm secondary to small bowel obstruction.    PT Comments   Paitent limited by overall pain and fatigue. Rn aware. Encouraged to ambulate with staff. Continue to recommend SNF for ongoing Physical Therapy.     Follow Up Recommendations  SNF;Supervision/Assistance - 24 hour;Other (comment)     Does the patient have the potential to tolerate intense rehabilitation     Barriers to Discharge        Equipment Recommendations  Rolling walker with 5" wheels;3in1 (PT)    Recommendations for Other Services    Frequency Min 3X/week   Progress towards PT Goals Progress towards PT goals: Progressing toward goals  Plan Current plan remains appropriate    Precautions / Restrictions Precautions Precautions: Fall Precaution Comments: NG tube   Pertinent Vitals/Pain Nauseated and premedicated    Mobility  Bed Mobility Supine to Sit: 5: Supervision Sitting - Scoot to Edge of Bed: 5: Supervision Transfers Sit to Stand: 4: Min guard;From bed Stand to Sit: 4: Min guard;To chair/3-in-1 Details for Transfer Assistance: Cues for hand placement Ambulation/Gait Ambulation/Gait Assistance: 4: Min assist Ambulation Distance (Feet): 25 Feet Assistive device: Rolling walker Ambulation/Gait Assistance Details: Safe use of RW. Cues for posture as patient lean over walker for comfort Gait Pattern: Step-through pattern;Decreased stride length;Narrow base of support;Trunk flexed;Shuffle Gait velocity: very decreased and guarded Stairs: Yes    Exercises     PT Diagnosis:    PT Problem List:   PT Treatment Interventions:     PT Goals (current goals can now be found in the care plan section)    Visit  Information  Last PT Received On: 02/03/13 Assistance Needed: +1 History of Present Illness: Pt is a 79 y.o. female who lives alone and was adm secondary to small bowel obstruction.     Subjective Data      Cognition       Balance     End of Session PT - End of Session Equipment Utilized During Treatment: Gait belt Activity Tolerance: Patient limited by pain;Patient limited by fatigue Patient left: with family/visitor present;with call bell/phone within reach;in chair Nurse Communication: Mobility status;Precautions   GP     Fredrich Birks 02/03/2013, 1:12 PM 02/03/2013 Fredrich Birks PTA [phone] pager [phone] office

## 2013-02-03 NOTE — Progress Notes (Signed)
Subjective: Pt doing about the same.  Still has intermittent cramping and pain.  Minimal nausea when pain is bad, but no vomiting.  NG only put out 340mL/24 hours.  C/o throat hurting and anxiety around taking the tube out.  Ambulating minimally.    Objective: Vital signs in last 24 hours: Temp:  [97.4 F (36.3 C)-98.2 F (36.8 C)] 98.2 F (36.8 C) (12/16 0531) Pulse Rate:  [73-79] 73 (12/16 0531) Resp:  [18-20] 18 (12/16 0531) BP: (131-149)/(50-74) 136/58 mmHg (12/16 0531) SpO2:  [94 %-97 %] 96 % (12/16 0531) Last BM Date: 01/27/13  Intake/Output from previous day: 12/15 0701 - 12/16 0700 In: 2482 [P.O.:100; I.V.:2382] Out: 600 [Urine:300; Emesis/NG output:300] Intake/Output this shift:    PE: Gen:  Alert, NAD, pleasant Card:  RRR, no M/G/R heard Pulm:  CTA, no W/R/R, good effort Abd: Soft, mild tenderness, ND, great BS today, no HSM, abdominal scars noted   Lab Results:   Recent Labs  02/01/13 1450  WBC 9.3  HGB 15.3*  HCT 46.5*  PLT 294   BMET  Recent Labs  02/01/13 1450 02/03/13 0504  NA 138 134*  K 3.8 4.1  CL 102 103  CO2 21 25  GLUCOSE 105* 151*  BUN 30* 13  CREATININE 0.62 0.60  CALCIUM 9.1 8.2*   PT/INR No results found for this basename: LABPROT, INR,  in the last 72 hours CMP     Component Value Date/Time   NA 134* 02/03/2013 0504   K 4.1 02/03/2013 0504   CL 103 02/03/2013 0504   CO2 25 02/03/2013 0504   GLUCOSE 151* 02/03/2013 0504   BUN 13 02/03/2013 0504   CREATININE 0.60 02/03/2013 0504   CALCIUM 8.2* 02/03/2013 0504   PROT 6.9 02/01/2013 1450   ALBUMIN 3.7 02/01/2013 1450   AST 12 02/01/2013 1450   ALT 8 02/01/2013 1450   ALKPHOS 56 02/01/2013 1450   BILITOT 0.6 02/01/2013 1450   GFRNONAA 84* 02/03/2013 0504   GFRAA >90 02/03/2013 0504   Lipase     Component Value Date/Time   LIPASE 19 02/01/2013 1450       Studies/Results: Ct Abdomen Pelvis W Contrast  02/01/2013   CLINICAL DATA:  Nausea, vomiting and  weakness.  Emesis.  EXAM: CT ABDOMEN AND PELVIS WITH CONTRAST  TECHNIQUE: Multidetector CT imaging of the abdomen and pelvis was performed using the standard protocol following bolus administration of intravenous contrast.  CONTRAST:  OMNIPAQUE IOHEXOL 300 MG/ML  SOLN  COMPARISON:  None.  FINDINGS: Lung Bases: Dependent atelectasis.  Liver: Mild intrahepatic biliary ductal dilation is present. No focal mass lesion.  Spleen:  Normal.  Gallbladder: Distended. No inflammatory changes. No calcified stones.  Common bile duct:  Normal common bile duct diameter.  Pancreas:  Mild prominence of pancreatic duct.  No mass lesion.  Adrenal glands:  Normal bilaterally.  Kidneys: Right renal sinus cyst. Normal enhancement and excretion. Smaller cysts are present in the right kidney. Both ureters appear normal.  Stomach:  Normal.  Small bowel: Dilated small bowel is present extending into the anatomic pelvis. There is fecalization of small bowel in the left lower quadrant. There is focal transition point identified in the left mid abdomen open (22 series 5). Fecalization is present proximal to this. No discrete mass lesion is identified however there is focal narrowing at the transition point. This may represent adhesive change or stricture.  Colon: Diverticulosis. Moderate stool burden. No colonic inflammatory changes.  Pelvic Genitourinary: Urinary bladder appears  normal. Small amount of free fluid in the anatomic pelvis. Hysterectomy. No pelvic adenopathy identified.  Bones: No aggressive osseous lesions. Lumbar spondylosis and facet arthrosis.  Vasculature: Atherosclerosis without aneurysm. No acute abnormality.  Body Wall: Normal.  IMPRESSION: Small-bowel obstruction with focal transition point in the left mid abdomen, likely involving jejunum. Differential considerations are stricture versus is adhesive band. No discrete mass lesion or adenopathy. Small amount of reactive free fluid.  Mild intrahepatic biliary ductal  dilation without an obstructing lesion identified. Correlation with bilirubin recommended. If normal, no further evaluation warranted. This recommendation follows ACR consensus guidelines: White Paper of the ACR Incidental Findings Committee II on Gallbladder and Biliary Findings. J Am Coll Radiol 2013:;10:953-956.   Electronically Signed   By: Andreas Newport M.D.   On: 02/01/2013 20:02   Dg Chest Portable 1 View  02/01/2013   CLINICAL DATA:  Vomiting  EXAM: PORTABLE CHEST - 1 VIEW  COMPARISON:  None.  FINDINGS: The heart size and mediastinal contours are within normal limits. Both lungs are clear. The visualized skeletal structures are unremarkable.  IMPRESSION: No active disease.   Electronically Signed   By: Marlan Palau M.D.   On: 02/01/2013 16:36   Dg Abd Portable 2v  02/02/2013   CLINICAL DATA:  Small bowel obstruction.  EXAM: PORTABLE ABDOMEN - 2 VIEW  COMPARISON:  CT of the abdomen and pelvis on 02/01/2013.  FINDINGS: Multiple dilated central small bowel loops are identified with maximal caliber of approximately 3.5 cm. Some air and stool is present throughout much of the colon. Decubitus film shows no evidence of free air. Excreted contrast is present in the bladder. Degenerative changes are present in the lumbar spine.  IMPRESSION: Radiographic pattern consistent with partial small bowel obstruction. No free intraperitoneal air is identified   Electronically Signed   By: Irish Lack M.D.   On: 02/02/2013 08:08    Anti-infectives: Anti-infectives   None       Assessment/Plan SBO - likely 2* adhesions from hysterectomy; fecalization of the proximal small bowel   Plan:  1. Conservative treatment with IVF, pain control, antiemetics  2. Clamp NG tube and check residuals today, may be able to d/c later today if not can try to d/c tomorrow if tolerating clamp 3. If conservative measure do not resolve her obstruction may need Ex lap with LOA in next few days  4. Ambulate and IS  5.  SCD's and lovenox  6. Repeat KUB, repeat labs in am, dulcolax prn, rocking chair, anxiety meds for when we take out her NG tube     LOS: 2 days    Cathy Martinez, Cathy Martinez 02/03/2013, 7:28 AM Pager: [phone]

## 2013-02-04 LAB — BASIC METABOLIC PANEL
BUN: 9 mg/dL (ref 6–23)
Chloride: 103 mEq/L (ref 96–112)
Creatinine, Ser: 0.59 mg/dL (ref 0.50–1.10)
GFR calc Af Amer: >90 mL/min (ref >90)
GFR calc non Af Amer: 85 mL/min — ABNORMAL LOW (ref >90)

## 2013-02-04 LAB — CBC
HCT: 41.3 % (ref 36.0–46.0)
MCHC: 33.9 g/dL (ref 30.0–36.0)
Platelets: 249 10*3/uL (ref 150–400)
RBC: 4.56 MIL/uL (ref 3.87–5.11)
RDW: 15 % (ref 11.5–15.5)
WBC: 4.5 10*3/uL (ref 4.0–10.5)

## 2013-02-04 MED ORDER — LORAZEPAM 2 MG/ML IJ SOLN
0.2500 mg | Freq: Once | INTRAMUSCULAR | Status: AC
  Administered 2013-02-04: 0.25 mg via INTRAVENOUS
  Filled 2013-02-04: qty 1

## 2013-02-04 NOTE — Progress Notes (Signed)
Had BMs, tolerating clears, NT Patient examined and I agree with the assessment and plan  Violeta Gelinas, MD, MPH, FACS Pager: [phone]  02/04/2013 3:22 PM

## 2013-02-04 NOTE — Clinical Social Work Note (Signed)
CSW met with pt's son at bedside. CSW presented SNF bed offers to pt's son. Pt's son to communicate with his siblings regarding discharge disposition. Pt's son requested information regarding home care for pt. CSW consulted with RN/CM regarding pt's son request. RN/CM to speak with pt's son. CSW to continue to follow and assist with discharge planning needs.  Darlyn Chamber, LCSWA Clinical Social Worker [phone]

## 2013-02-04 NOTE — Care Management Note (Signed)
  Page 2 of 2   02/06/2013     2:38:55 PM   CARE MANAGEMENT NOTE 02/06/2013  Patient:  Cathy Martinez, Cathy Martinez   Account Number:  [id number]  Date Initiated:  02/04/2013  Documentation initiated by:  Ronny Flurry  Subjective/Objective Assessment:     Action/Plan:   Anticipated DC Date:     Anticipated DC Plan:  HOME W HOME HEALTH SERVICES         Choice offered to / List presented to:  C-4 Adult Children        HH arranged  HH-1 RN  HH-2 PT  HH-3 OT  HH-4 NURSE'S AIDE      HH agency  [facility]   Status of service:  Completed, signed off Medicare Important Message given?   (If response is "NO", the following Medicare IM given date fields will be blank) Date Medicare IM given:   Date Additional Medicare IM given:    Discharge Disposition:    Per UR Regulation:    If discussed at Long Length of Stay Meetings, dates discussed:    Comments:   02-06-13 Met with patient and patient's daughter and friend at bedside . Provided list home health agencies , and private duty care agencies . Patient's daughter stated that family can provide 24 hour assistance at home .  Chosen [facility] . Daughter states they are getting a rolling walker , bedside commode and shower chair from a friend.  Patient's address is [address], [place]  Ronny Flurry RN BSN [phone]   02-04-13 Patient and son Cathy Martinez wanted home health options . Provided a list of home health agencies . Patient does not have 24 hour assistance at home . Provided list of private pay agencies. Cathy Martinez and patinet at present favoring short term rehab and Cathy Martinez  will start touring SNF's .  Patient and son have my contact information. SW aware. Ronny Flurry RN SBN [phone]

## 2013-02-04 NOTE — Progress Notes (Signed)
  Subjective: Pt feeling much better.  Had mild nausea last night and was on suction for a few hours, but only had out after residuals were checked.  She had 2 normal BM's last night and this am which has significantly improved her pain.  She's thirsty, but anxious to have her NG tube out and requesting anxiety meds before removal.     Objective: Vital signs in last 24 hours: Temp:  [98 F (36.7 C)-98.6 F (37 C)] 98 F (36.7 C) (12/17 0608) Pulse Rate:  [73-83] 83 (12/17 0608) Resp:  [17-20] 17 (12/17 0608) BP: (135-141)/(63-64) 136/64 mmHg (12/17 0608) SpO2:  [94 %-97 %] 95 % (12/17 0608) Last BM Date: 02/01/13  Intake/Output from previous day: 12/16 0701 - 12/17 0700 In: 2455 [I.V.:2455] Out: 1700 [Urine:1550; Emesis/NG output:150] Intake/Output this shift:    PE: Gen:  Alert, NAD, pleasant Abd: Soft, NT/ND, +BS, no HSM   Lab Results:   Recent Labs  02/01/13 1450 02/04/13 0445  WBC 9.3 4.5  HGB 15.3* 14.0  HCT 46.5* 41.3  PLT 294 249   BMET  Recent Labs  02/03/13 0504 02/04/13 0445  NA 134* 135  K 4.1 4.0  CL 103 103  CO2 25 23  GLUCOSE 151* 116*  BUN 13 9  CREATININE 0.60 0.59  CALCIUM 8.2* 8.3*   PT/INR No results found for this basename: LABPROT, INR,  in the last 72 hours CMP     Component Value Date/Time   NA 135 02/04/2013 0445   K 4.0 02/04/2013 0445   CL 103 02/04/2013 0445   CO2 23 02/04/2013 0445   GLUCOSE 116* 02/04/2013 0445   BUN 9 02/04/2013 0445   CREATININE 0.59 02/04/2013 0445   CALCIUM 8.3* 02/04/2013 0445   PROT 6.9 02/01/2013 1450   ALBUMIN 3.7 02/01/2013 1450   AST 12 02/01/2013 1450   ALT 8 02/01/2013 1450   ALKPHOS 56 02/01/2013 1450   BILITOT 0.6 02/01/2013 1450   GFRNONAA 85* 02/04/2013 0445   GFRAA >90 02/04/2013 0445   Lipase     Component Value Date/Time   LIPASE 19 02/01/2013 1450       Studies/Results: Dg Abd Portable 2v  02/03/2013   CLINICAL DATA:  Recheck partial small bowel  obstruction.  EXAM: PORTABLE ABDOMEN - 2 VIEW  COMPARISON:  02/02/2013  FINDINGS: NG tube is in the stomach. Improvement in small bowel distention/dilatation. Continue mild dilatation of left abdominal and pelvic small bowel loops.  IMPRESSION: Improved small bowel distention, but continued partial small bowel obstruction pattern.   Electronically Signed   By: Charlett Nose M.D.   On: 02/03/2013 09:08    Anti-infectives: Anti-infectives   None       Assessment/Plan pSBO - likely 2* adhesions from hysterectomy; fecalization of the proximal small bowel   Plan:  1. D/C NG tube and start clear liquids, hopefully will continue to improve without surgical intervention 3. If conservative measure do not resolve her obstruction may need Ex lap with LOA in next few days  4. Ambulate and IS  5. SCD's and lovenox  6. Repeat KUB showed improvement, labs normal, hopefully can advance diet today and tomorrow and d/c to SNF tomorrow if bed available, continue PT/OT     LOS: 3 days    DORT, [facility] 02/04/2013, 7:32 AM Pager: [phone]

## 2013-02-05 MED ORDER — PROMETHAZINE HCL 25 MG/ML IJ SOLN
12.5000 mg | INTRAMUSCULAR | Status: DC | PRN
  Administered 2013-02-05: 12.5 mg via INTRAVENOUS
  Filled 2013-02-05: qty 1

## 2013-02-05 MED ORDER — PANTOPRAZOLE SODIUM 40 MG PO TBEC
40.0000 mg | DELAYED_RELEASE_TABLET | Freq: Every day | ORAL | Status: DC
  Administered 2013-02-05 – 2013-02-06 (×2): 40 mg via ORAL
  Filled 2013-02-05 (×2): qty 1

## 2013-02-05 MED ORDER — BOOST PLUS PO LIQD
237.0000 mL | Freq: Three times a day (TID) | ORAL | Status: DC
  Administered 2013-02-06 – 2013-02-13 (×11): 237 mL via ORAL
  Filled 2013-02-05 (×6): qty 237
  Filled 2013-02-05: qty 1
  Filled 2013-02-05 (×10): qty 237
  Filled 2013-02-05: qty 1
  Filled 2013-02-05 (×13): qty 237

## 2013-02-05 NOTE — Progress Notes (Signed)
  Subjective: Pt feels overall improved, but last night didn't want to eat much of her dinner.  Denies N/V, only minimal abdominal discomfort.  Ambulating OOB with nursing staff and PT/OT.  Looking forward to breakfast.  Appetite good.  Had another small BM last night or this am.  Urinating well.    Objective: Vital signs in last 24 hours: Temp:  [97.7 F (36.5 C)-98.4 F (36.9 C)] 98.4 F (36.9 C) (12/18 0449) Pulse Rate:  [78-95] 85 (12/18 0449) Resp:  [16-20] 20 (12/18 0449) BP: (109-138)/(59-60) 125/60 mmHg (12/18 0449) SpO2:  [91 %-96 %] 96 % (12/18 0449) Last BM Date: 02/04/13  Intake/Output from previous day: 12/17 0701 - 12/18 0700 In: 360 [P.O.:360] Out: -  Intake/Output this shift:    PE: Gen:  Alert, NAD, pleasant Card:  RRR, no M/G/R heard Pulm:  CTA, no W/R/R, good effort, IS at 625 Abd: Soft, ND, mild upper abdominal tenderness, +BS, no HSM, abdominal scars noted Ext:  No erythema, edema, or tenderness  Lab Results:   Recent Labs  02/04/13 0445  WBC 4.5  HGB 14.0  HCT 41.3  PLT 249   BMET  Recent Labs  02/03/13 0504 02/04/13 0445  NA 134* 135  K 4.1 4.0  CL 103 103  CO2 25 23  GLUCOSE 151* 116*  BUN 13 9  CREATININE 0.60 0.59  CALCIUM 8.2* 8.3*   PT/INR No results found for this basename: LABPROT, INR,  in the last 72 hours CMP     Component Value Date/Time   NA 135 02/04/2013 0445   K 4.0 02/04/2013 0445   CL 103 02/04/2013 0445   CO2 23 02/04/2013 0445   GLUCOSE 116* 02/04/2013 0445   BUN 9 02/04/2013 0445   CREATININE 0.59 02/04/2013 0445   CALCIUM 8.3* 02/04/2013 0445   PROT 6.9 02/01/2013 1450   ALBUMIN 3.7 02/01/2013 1450   AST 12 02/01/2013 1450   ALT 8 02/01/2013 1450   ALKPHOS 56 02/01/2013 1450   BILITOT 0.6 02/01/2013 1450   GFRNONAA 85* 02/04/2013 0445   GFRAA >90 02/04/2013 0445   Lipase     Component Value Date/Time   LIPASE 19 02/01/2013 1450       Studies/Results: Dg Abd Portable 2v  02/03/2013    CLINICAL DATA:  Recheck partial small bowel obstruction.  EXAM: PORTABLE ABDOMEN - 2 VIEW  COMPARISON:  02/02/2013  FINDINGS: NG tube is in the stomach. Improvement in small bowel distention/dilatation. Continue mild dilatation of left abdominal and pelvic small bowel loops.  IMPRESSION: Improved small bowel distention, but continued partial small bowel obstruction pattern.   Electronically Signed   By: Charlett Nose M.D.   On: 02/03/2013 09:08    Anti-infectives: Anti-infectives   None       Assessment/Plan pSBO - likely 2* adhesions from hysterectomy; fecalization of the proximal small bowel   Plan:  1. Tolerated clears and some fulls yesterday, advancing to soft diet this am (encouraged soft bland foods) 3. Hopefully will continue to improve without surgical intervention 4. Ambulate and IS  5. SCD's and lovenox  6. May be medically stable to d/c to SNF this afternoon when bed available and insurance approves, continue PT/OT     LOS: 4 days    DORT, [facility] 02/05/2013, 7:50 AM Pager: [phone]

## 2013-02-05 NOTE — Progress Notes (Signed)
Physical Therapy Treatment Patient Details Name: Cathy Martinez MRN: [id number] DOB: [birth year] Today's Date: 02/05/2013 Time: 4098-1191 PT Time Calculation (min): 30 min  PT Assessment / Plan / Recommendation  History of Present Illness Pt is a 79 y.o. female who lives alone and was adm secondary to small bowel obstruction.    PT Comments   Patient progressing very well and awaiting placement to [facility]. Will continue to follow  Follow Up Recommendations  SNF;Supervision/Assistance - 24 hour;Other (comment)     Does the patient have the potential to tolerate intense rehabilitation     Barriers to Discharge        Equipment Recommendations  Rolling walker with 5" wheels;3in1 (PT)    Recommendations for Other Services    Frequency Min 3X/week   Progress towards PT Goals Progress towards PT goals: Progressing toward goals  Plan Current plan remains appropriate    Precautions / Restrictions Precautions Precautions: Fall   Pertinent Vitals/Pain no apparent distress     Mobility  Bed Mobility Supine to Sit: 5: Supervision Transfers Sit to Stand: 5: Supervision Stand to Sit: 5: Supervision Details for Transfer Assistance: supervision for safety. Cues not to pull up from Rw Ambulation/Gait Ambulation/Gait Assistance: 4: Min guard Ambulation Distance (Feet): 160 Feet Assistive device: Rolling walker Ambulation/Gait Assistance Details: Cues to stay close to Rw Gait Pattern: Step-through pattern;Decreased stride length;Narrow base of support;Trunk flexed;Shuffle Gait velocity: very decreased and guarded    Exercises     PT Diagnosis:    PT Problem List:   PT Treatment Interventions:     PT Goals (current goals can now be found in the care plan section)    Visit Information  Last PT Received On: 02/05/13 Assistance Needed: +1 History of Present Illness: Pt is a 79 y.o. female who lives alone and was adm secondary to small bowel obstruction.     Subjective  Data      Cognition  Cognition Arousal/Alertness: Awake/alert Behavior During Therapy: WFL for tasks assessed/performed Overall Cognitive Status: Within Functional Limits for tasks assessed    Balance     End of Session PT - End of Session Equipment Utilized During Treatment: Gait belt Activity Tolerance: Patient tolerated treatment well Patient left: in chair;with call bell/phone within reach Nurse Communication: Mobility status;Precautions   GP     Fredrich Birks 02/05/2013, 2:59 PM  02/05/2013 Fredrich Birks PTA [phone] pager [phone] office

## 2013-02-05 NOTE — Clinical Social Work Note (Signed)
CSW spoke to pt's daughter, Freddrick March ([phone]). Per Alvino Chapel, family's first choice for SNF placement is [facility]. Alvino Chapel to provide CSW with second choice on 02/06/2013. CSW informed Lyman Speller of family' bed choice. CSW to continue to follow and assist with discharge planning needs.  Darlyn Chamber, LCSWA Clinical Social Worker [phone]

## 2013-02-05 NOTE — Clinical Social Work Note (Signed)
CSW submitted clinicals to [facility].  CSW awaiting authorization.  Vickii Penna, LCSWA [phone]  Clinical Social Work

## 2013-02-05 NOTE — Progress Notes (Signed)
Had another BM. Ambulating,. Patient examined and I agree with the assessment and plan  Violeta Gelinas, MD, MPH, FACS Pager: [phone]  02/05/2013 1:43 PM

## 2013-02-06 ENCOUNTER — Inpatient Hospital Stay (HOSPITAL_COMMUNITY): Payer: Medicare Other

## 2013-02-06 LAB — BASIC METABOLIC PANEL
BUN: 11 mg/dL (ref 6–23)
Calcium: 8.8 mg/dL (ref 8.4–10.5)
Chloride: 103 mEq/L (ref 96–112)
Creatinine, Ser: 0.64 mg/dL (ref 0.50–1.10)
GFR calc Af Amer: >90 mL/min (ref >90)

## 2013-02-06 MED ORDER — ONDANSETRON HCL 4 MG/2ML IJ SOLN
INTRAMUSCULAR | Status: AC
  Administered 2013-02-06: 4 mg
  Filled 2013-02-06: qty 2

## 2013-02-06 MED ORDER — ONDANSETRON HCL 4 MG/2ML IJ SOLN
4.0000 mg | Freq: Four times a day (QID) | INTRAMUSCULAR | Status: DC | PRN
  Administered 2013-02-08 – 2013-02-12 (×3): 4 mg via INTRAVENOUS
  Filled 2013-02-06 (×3): qty 2

## 2013-02-06 MED ORDER — PROMETHAZINE HCL 25 MG/ML IJ SOLN
12.5000 mg | Freq: Four times a day (QID) | INTRAMUSCULAR | Status: DC | PRN
  Administered 2013-02-06 – 2013-02-08 (×3): 12.5 mg via INTRAVENOUS
  Filled 2013-02-06 (×3): qty 1

## 2013-02-06 MED ORDER — ONDANSETRON HCL 4 MG PO TABS: 4.0000 mg | ORAL_TABLET | Freq: Four times a day (QID) | ORAL | Status: DC | PRN

## 2013-02-06 MED ORDER — PROMETHAZINE HCL 25 MG PO TABS
12.5000 mg | ORAL_TABLET | Freq: Four times a day (QID) | ORAL | Status: DC | PRN
  Administered 2013-02-06: 25 mg via ORAL
  Filled 2013-02-06: qty 1

## 2013-02-06 NOTE — Plan of Care (Signed)
Problem: Food- and Nutrition-Related Knowledge Deficit (NB-1.1) Goal: Nutrition education Formal process to instruct or train a patient/client in a skill or to impart knowledge to help patients/clients voluntarily manage or modify food choices and eating behavior to maintain or improve health. Outcome: Completed/Met Date Met:  02/06/13 Nutrition Education Note  RD consulted for nutrition education regarding a low-fiber diet followed by a high-fiber diet.   RD provided "Low-fiber nutrition therapy" handout followed by "high-fiber nutrition therapy" from the [employer] of [employer]. Reviewed patient's dietary recall. Provided examples on ways to modify the amount of fiber intake in diet.  Encouraged fresh fruits and vegetables as well as whole grain sources of carbohydrates to maximize fiber intake once transitioned to a high-fiber diet. Teach back method used.  Expect good compliance.  Body mass index is 22.78 kg/(m^2). Pt meets criteria for normal body weight based on current BMI.  Current diet order is full liquid, patient is consuming approximately 25% of meals at this time. Labs and medications reviewed. No further nutrition interventions warranted at this time. RD contact information provided. If additional nutrition issues arise, please re-consult RD.  Ebbie Latus RD, LDN

## 2013-02-06 NOTE — Progress Notes (Signed)
  Subjective: Yesterday had an episodes of nausea and vomiting.  Feeling much better this am.  Had multiple BM's yesterday and continues to pass flatus.  She tolerated some sips of clears last night, but did not drink anything on the tray.  Thirsty and hungry this am.  Abdominal bloating/pain and N/V resolved.  Pain suprapubically and epigastric.  The patient thinks she got sick from some of the foods she ate.  Complains of upper leg cramping.    Daughter at bedside.  She notes the family is okay with SNF, but if she improves and they can arrange [facility] PT/OT/nursing may be interested in taking her home.  The family is attempting to arrange 24 hour assist, but if unable are still okay with SNF.    Objective: Vital signs in last 24 hours: Temp:  [98 F (36.7 C)-98.5 F (36.9 C)] 98 F (36.7 C) (12/19 0625) Pulse Rate:  [77-79] 79 (12/19 0625) Resp:  [20] 20 (12/19 0625) BP: (111-115)/(59-64) 111/64 mmHg (12/19 0625) SpO2:  [95 %] 95 % (12/19 0625) Last BM Date: 02/05/13  Intake/Output from previous day: 12/18 0701 - 12/19 0700 In: 2080 [P.O.:960; I.V.:1120] Out: 401 [Urine:400; Stool:1] Intake/Output this shift:    PE: Gen:  Alert, NAD, pleasant Pulm:  IS at 750 Abd: Soft, minimally tender, ND, +BS, no HSM, lower midline abdominal scar noted   Lab Results:   Recent Labs  02/04/13 0445  WBC 4.5  HGB 14.0  HCT 41.3  PLT 249   BMET  Recent Labs  02/04/13 0445  NA 135  K 4.0  CL 103  CO2 23  GLUCOSE 116*  BUN 9  CREATININE 0.59  CALCIUM 8.3*   PT/INR No results found for this basename: LABPROT, INR,  in the last 72 hours CMP     Component Value Date/Time   NA 135 02/04/2013 0445   K 4.0 02/04/2013 0445   CL 103 02/04/2013 0445   CO2 23 02/04/2013 0445   GLUCOSE 116* 02/04/2013 0445   BUN 9 02/04/2013 0445   CREATININE 0.59 02/04/2013 0445   CALCIUM 8.3* 02/04/2013 0445   PROT 6.9 02/01/2013 1450   ALBUMIN 3.7 02/01/2013 1450   AST 12 02/01/2013 1450   ALT 8 02/01/2013 1450   ALKPHOS 56 02/01/2013 1450   BILITOT 0.6 02/01/2013 1450   GFRNONAA 85* 02/04/2013 0445   GFRAA >90 02/04/2013 0445   Lipase     Component Value Date/Time   LIPASE 19 02/01/2013 1450       Studies/Results: No results found.  Anti-infectives: Anti-infectives   None       Assessment/Plan pSBO - likely 2* adhesions from hysterectomy  Plan:  1.  No evidence of obstruction currently.  Will attempt to re-advance diet to fulls at lunch if tolerates clears this am.  Maybe re-try soft diet tomorrow am and at lunch then dc if tolerating. 2.  If not improved would order a UGI with SBFT with WS contrast study to eval for obstruction.  Depending on results of UGI may need LOA surgery. 3. Order Bmet 2* leg cramping, getting K in bag 4. Ambulate and IS - PT 5. SCD's and lovenox  6. May be medically stable to d/c to SNF or home with [facility] tomorrow am.     LOS: 5 days    Cathy Martinez, Cathy Martinez 02/06/2013, 8:26 AM Pager: [phone]

## 2013-02-06 NOTE — Progress Notes (Signed)
Up ambulating earlier. Will check SBFT as still having nausea and low appetite despite having BMs. Patient examined and I agree with the assessment and plan  Violeta Gelinas, MD, MPH, FACS Pager: [phone]  02/06/2013 2:49 PM

## 2013-02-06 NOTE — Clinical Social Work Note (Signed)
CSW and RNCM met with pt's daughter, Alvino Chapel, at bedside. Pt's daughter stated that family will be taking pt home with home health services once medically stable for discharge. CSW signing off.  Darlyn Chamber, LCSWA Clinical Social Worker [phone]

## 2013-02-07 ENCOUNTER — Inpatient Hospital Stay (HOSPITAL_COMMUNITY): Payer: Medicare Other

## 2013-02-07 MED ORDER — PANTOPRAZOLE SODIUM 40 MG IV SOLR
40.0000 mg | INTRAVENOUS | Status: DC
  Administered 2013-02-07 – 2013-02-10 (×4): 40 mg via INTRAVENOUS
  Filled 2013-02-07 (×5): qty 40

## 2013-02-07 NOTE — Progress Notes (Signed)
Patients family with lots of questions concerning patients care, PA  Will Marlyne Beards talked with family and questions by nurse was answered. Son visiting and wanting having more questions trying to call MD/PA for family

## 2013-02-07 NOTE — Progress Notes (Signed)
Had nausea this am. Multiple BMs after SBFT. No pain  Alert, nad Soft, NT, nd  Clear liquids today Oob, pt  Cathy Martinez. Andrey Campanile, MD, FACS General, Bariatric, & Minimally Invasive Surgery [facility], [place]

## 2013-02-07 NOTE — Progress Notes (Signed)
Subjective: She had nausea and vomited after UGI, got some MS,and something for nausea. She did better after that.  Around 11 PM she started having allot of uncontrolled loose stool, and now she feels much better.  No nausea this AM and says this is the best she has felt since she came in.    Objective: Vital signs in last 24 hours: Temp:  [98 F (36.7 C)-99.1 F (37.3 C)] 99.1 F (37.3 C) (12/20 0606) Pulse Rate:  [80-102] 102 (12/20 0606) Resp:  [18-20] 20 (12/20 0606) BP: (118-136)/(50-61) 118/50 mmHg (12/20 0606) SpO2:  [93 %-94 %] 94 % (12/20 0606) Last BM Date: 02/06/13 Vomited X 1 yesterday Afebrile, VSS BMP OK yesterday UGI with SBF:  Small bowel follow-through compatible with partial small bowel obstruction with dilated proximal and mid small bowel loops and decompressed small bowel loops distally. Free gastroesophageal reflux into the cervical esophagus. Proximal posterior gastric diverticulum. Esophageal dysmotility.   Intake/Output from previous day: 12/19 0701 - 12/20 0700 In: 1220.7 [P.O.:220; I.V.:1000.7] Out: 1 [Stool:1] Intake/Output this shift:    General appearance: alert, cooperative and no distress Resp: clear to auscultation bilaterally GI: soft, no distension, BS still a little hyperactive. no abdominal discomfort.  Lab Results:  No results found for this basename: WBC, HGB, HCT, PLT,  in the last 72 hours  BMET  Recent Labs  02/06/13 1000  NA 135  K 4.1  CL 103  CO2 25  GLUCOSE 110*  BUN 11  CREATININE 0.64  CALCIUM 8.8   PT/INR No results found for this basename: LABPROT, INR,  in the last 72 hours   Recent Labs Lab 02/01/13 1450  AST 12  ALT 8  ALKPHOS 56  BILITOT 0.6  PROT 6.9  ALBUMIN 3.7     Lipase     Component Value Date/Time   LIPASE 19 02/01/2013 1450     Studies/Results: Dg Ugi W/small Bowel  02/06/2013   CLINICAL DATA:  Partial small bowel obstruction.  EXAM: UPPER GI SERIES WITH SMALL BOWEL  FOLLOW-THROUGH  TECHNIQUE: Combined double contrast and single contrast upper GI series using effervescent crystals, thick barium, and thin barium. Subsequently, serial images of the small bowel were obtained including spot views of the terminal ileum.  COMPARISON:  CT 02/01/2013  FLUOROSCOPY TIME:  2 min, 17 seconds  FINDINGS: Upper GI demonstrates esophageal dysmotility and tertiary contractions. No fixed stricture or fold thickening. No mass.  There is a small to moderate-sized diverticulum off the proximal stomach posteriorly. Stomach otherwise unremarkable. Duodenal bulb and duodenal sweep unremarkable.  Free severe gastroesophageal reflux noted during the upper GI portion of the study into the cervical esophagus.  Proximal and mid small bowel loops are dilated. There are decompressed distal small bowel loops noted in the midline of the pelvis and right lower quadrant. Exact transition point is not visualized. No visible focal small bowel mass lesion or other focal abnormality. Transit time to the colon is approximately 4 hr and 30 min.  IMPRESSION: Small bowel follow-through compatible with partial small bowel obstruction with dilated proximal and mid small bowel loops and decompressed small bowel loops distally.  Free gastroesophageal reflux into the cervical esophagus.  Proximal posterior gastric diverticulum.  Esophageal dysmotility.   Electronically Signed   By: Charlett Nose M.D.   On: 02/06/2013 20:03    Medications: . enoxaparin (LOVENOX) injection  40 mg Subcutaneous Q24H  . lactose free nutrition  237 mL Oral TID WC  . pantoprazole  40 mg  Oral Q1200   . dextrose 5 % and 0.45 % NaCl with KCl 20 mEq/L 75 mL/hr at 02/06/13 2032   Prior to Admission medications   Medication Sig Start Date End Date Taking? Authorizing Provider  albuterol (PROVENTIL HFA;VENTOLIN HFA) 108 (90 BASE) MCG/ACT inhaler Inhale 1 puff into the lungs every 6 (six) hours as needed for wheezing or shortness of breath.   Yes  Historical Provider, MD  Ascorbic Acid (VITAMIN C GUMMIE PO) Take 1 tablet by mouth daily.   Yes Historical Provider, MD  carboxymethylcellulose (REFRESH PLUS) 0.5 % SOLN Place 1 drop into both eyes 3 (three) times daily as needed (eye irritation).   Yes Historical Provider, MD  docusate sodium (COLACE) 50 MG capsule Take 50 mg by mouth daily as needed for mild constipation.   Yes Historical Provider, MD  Fish Oil OIL Take 1 capsule by mouth daily.   Yes Historical Provider, MD  Multiple Vitamins-Minerals (MULTIVITAMIN WITH MINERALS) tablet Take 1 tablet by mouth daily.   Yes Historical Provider, MD     Assessment/Plan PSBO, S/P LIH with mesh 2001(admit 02/01/13) Severe GE reflux and dysmotility diverticulum of the proximal stomach   Plan:  Try clear liquids again, repeat 2 view film.  Recheck labs in AM.    LOS: 6 days    Cathy Martinez 02/07/2013

## 2013-02-08 ENCOUNTER — Inpatient Hospital Stay (HOSPITAL_COMMUNITY): Payer: Medicare Other

## 2013-02-08 LAB — CBC
HCT: 37.9 % (ref 36.0–46.0)
MCH: 30.6 pg (ref 26.0–34.0)
MCHC: 34 g/dL (ref 30.0–36.0)
Platelets: 240 10*3/uL (ref 150–400)
RBC: 4.22 MIL/uL (ref 3.87–5.11)
RDW: 14.7 % (ref 11.5–15.5)
WBC: 5.6 10*3/uL (ref 4.0–10.5)

## 2013-02-08 LAB — BASIC METABOLIC PANEL
BUN: 8 mg/dL (ref 6–23)
CO2: 22 mEq/L (ref 19–32)
Creatinine, Ser: 0.63 mg/dL (ref 0.50–1.10)
GFR calc Af Amer: >90 mL/min (ref >90)
GFR calc non Af Amer: 83 mL/min — ABNORMAL LOW (ref >90)
Potassium: 4.1 mEq/L (ref 3.5–5.1)
Sodium: 138 mEq/L (ref 135–145)

## 2013-02-08 MED ORDER — POTASSIUM CHLORIDE IN NACL 20-0.9 MEQ/L-% IV SOLN
INTRAVENOUS | Status: DC
  Administered 2013-02-08 – 2013-02-13 (×7): via INTRAVENOUS
  Filled 2013-02-08 (×8): qty 1000

## 2013-02-08 NOTE — Progress Notes (Signed)
Lots of bms. Min nausea. Not much pain  Alert, nad Soft, nd  Xray - contrast in colon   Adv diet Hopefully home in next day or so  Mary Sella. Andrey Campanile, MD, FACS General, Bariatric, & Minimally Invasive Surgery [facility], [place]

## 2013-02-08 NOTE — Progress Notes (Signed)
Subjective: Had one episode of nausea yesterday after the xray, none after that.  She had allot of diarrhea yesterday, and her IV was leaking.  Some pain/discomfort last PM, still having loose stools, but overall feels better.  Objective: Vital signs in last 24 hours: Temp:  [97.9 F (36.6 C)-98.3 F (36.8 C)] 97.9 F (36.6 C) (12/21 0446) Pulse Rate:  [68-72] 72 (12/21 0446) Resp:  [20] 20 (12/21 0446) BP: (110-115)/(43-56) 115/56 mmHg (12/21 0446) SpO2:  [95 %-96 %] 95 % (12/21 0446) Last BM Date: 2013/02/22 Vomited x 1 1 stool recorded PO recorded Afebrile, VSS CBC OK BMP pending  Single view film pending Intake/Output from previous day: 02-23-23 0701 - 12/21 0700 In: 931.3 [P.O.:100; I.V.:831.3] Out: 2 [Emesis/NG output:1; Stool:1] Intake/Output this shift:    General appearance: alert, cooperative and no distress Resp: clear to auscultation bilaterally GI: soft, not tender this AM, few BS, +BM's  Lab Results:   Recent Labs  02/08/13 0545  WBC 5.6  HGB 12.9  HCT 37.9  PLT 240    BMET  Recent Labs  02/06/13 1000 02/08/13 0545  NA 135 138  K 4.1 4.1  CL 103 106  CO2 25 22  GLUCOSE 110* 105*  BUN 11 8  CREATININE 0.64 0.63  CALCIUM 8.8 8.3*   PT/INR No results found for this basename: LABPROT, INR,  in the last 72 hours   Recent Labs Lab 02/01/13 1450  AST 12  ALT 8  ALKPHOS 56  BILITOT 0.6  PROT 6.9  ALBUMIN 3.7     Lipase     Component Value Date/Time   LIPASE 19 02/01/2013 1450     Studies/Results: Dg Abd 2 Views  2013/02/22   CLINICAL DATA:  Partial small bowel obstruction.  EXAM: ABDOMEN - 2 VIEW  COMPARISON:  Images from a small bowel follow-through study of February 06, 2013.  FINDINGS: Supine and left-side-down decubitus films of the abdomen are reviewed. There has been progression of the orally administered contrast through the mildly distended small bowel loops. There is contrast within the colon and rectum. There is  exuberant sigmoid diverticulosis. There remain numerous loops of mildly distended small bowel consistent with the clinically known partial small bowel obstruction. No extraluminal contrast is demonstrated.  IMPRESSION: The findings are consistent with a partial distal small bowel obstruction. Contrast is now present within the rectum. There is exuberant rectosigmoid diverticulosis.   Electronically Signed   By: David  [place]   On: 02-22-2013 11:19   Dg Abd Acute W/chest  02/08/2013   CLINICAL DATA:  Partial small bowel obstruction, abdominal pain.  EXAM: ACUTE ABDOMEN SERIES (ABDOMEN 2 VIEW & CHEST 1 VIEW)  COMPARISON:  22-Feb-2013  FINDINGS: Mild elevation of the right hemidiaphragm. Right base atelectasis. Left lung is clear. Heart is normal size.  Oral contrast material noted throughout the colon with extensive colonic diverticulosis, both in the sigmoid colon and at the hepatic flexure. Near complete clearance of contrast from the small bowel. Mildly prominent left and lower abdominal small bowel loops compatible with known partial small bowel obstruction. This may be slightly improved since prior study.  IMPRESSION: Suspect improvement in the partial small bowel obstruction.  Colonic diverticulosis.  Right base atelectasis.   Electronically Signed   By: Charlett Nose M.D.   On: 02/08/2013 08:27   Dg Ugi W/small Bowel  02/06/2013   CLINICAL DATA:  Partial small bowel obstruction.  EXAM: UPPER GI SERIES WITH SMALL BOWEL FOLLOW-THROUGH  TECHNIQUE: Combined  double contrast and single contrast upper GI series using effervescent crystals, thick barium, and thin barium. Subsequently, serial images of the small bowel were obtained including spot views of the terminal ileum.  COMPARISON:  CT 02/01/2013  FLUOROSCOPY TIME:  2 min, 17 seconds  FINDINGS: Upper GI demonstrates esophageal dysmotility and tertiary contractions. No fixed stricture or fold thickening. No mass.  There is a small to moderate-sized  diverticulum off the proximal stomach posteriorly. Stomach otherwise unremarkable. Duodenal bulb and duodenal sweep unremarkable.  Free severe gastroesophageal reflux noted during the upper GI portion of the study into the cervical esophagus.  Proximal and mid small bowel loops are dilated. There are decompressed distal small bowel loops noted in the midline of the pelvis and right lower quadrant. Exact transition point is not visualized. No visible focal small bowel mass lesion or other focal abnormality. Transit time to the colon is approximately 4 hr and 30 min.  IMPRESSION: Small bowel follow-through compatible with partial small bowel obstruction with dilated proximal and mid small bowel loops and decompressed small bowel loops distally.  Free gastroesophageal reflux into the cervical esophagus.  Proximal posterior gastric diverticulum.  Esophageal dysmotility.   Electronically Signed   By: Charlett Nose M.D.   On: 02/06/2013 20:03    Medications: . enoxaparin (LOVENOX) injection  40 mg Subcutaneous Q24H  . lactose free nutrition  237 mL Oral TID WC  . pantoprazole (PROTONIX) IV  40 mg Intravenous Q24H    Assessment/Plan PSBO, S/P LIH with mesh 2001(admit 02/01/13)  Severe GE reflux and dysmotility  diverticulum of the proximal stomach   Plan:  Advance to a full liquid diet today and see how she does. BMP still pending basic metabolic panel normal   LOS: 7 days    Rochelle Larue 02/08/2013

## 2013-02-09 ENCOUNTER — Inpatient Hospital Stay (HOSPITAL_COMMUNITY): Payer: Medicare Other

## 2013-02-09 LAB — BASIC METABOLIC PANEL
CO2: 24 mEq/L (ref 19–32)
Chloride: 105 mEq/L (ref 96–112)
Creatinine, Ser: 0.72 mg/dL (ref 0.50–1.10)
GFR calc Af Amer: >90 mL/min (ref >90)
GFR calc non Af Amer: 80 mL/min — ABNORMAL LOW (ref >90)
Potassium: 5 mEq/L (ref 3.5–5.1)
Sodium: 139 mEq/L (ref 135–145)

## 2013-02-09 NOTE — Progress Notes (Signed)
Lots of contrast in the colon, bowel is decompressed. Eating better t his AM without nausea.  No plans for surgery currently.  Will advance to soft diet tomorrow if she continues to do well today.  If not may require laparotomy.  Marta Lamas. Gae Bon, MD, FACS [phone] [facility]

## 2013-02-09 NOTE — Progress Notes (Signed)
Subjective: She did Ok at breakfast yesterday and had some creamed soup at lunch, got sick last pM with broth.  Still having loose stools, mostly liquid.  Spent most of the day yesterday up, and feels better this AM.   She vomited small amounts yesterday after breakfast and supper. Objective: Vital signs in last 24 hours: Temp:  [97.4 F (36.3 C)-98.3 F (36.8 C)] 97.4 F (36.3 C) (12/22 0513) Pulse Rate:  [64-89] 64 (12/22 0513) Resp:  [18] 18 (12/22 0513) BP: (102-142)/(44-68) 118/44 mmHg (12/22 0513) SpO2:  [97 %-98 %] 98 % (12/22 0513) Last BM Date: 2013-03-08 I/O?  +BM Afebrile, VSS No labs Intake/Output from previous day: 12/21 0701 - 12/22 0700 In: 368.3 [P.O.:150; I.V.:218.3] Out: -  Intake/Output this shift:    General appearance: alert, cooperative and no distress Resp: clear to auscultation bilaterally GI: soft, non-tender; bowel sounds normal; no masses,  no organomegaly  Lab Results:   Recent Labs  02/08/13 0545  WBC 5.6  HGB 12.9  HCT 37.9  PLT 240    BMET  Recent Labs  02/06/13 1000 02/08/13 0545  NA 135 138  K 4.1 4.1  CL 103 106  CO2 25 22  GLUCOSE 110* 105*  BUN 11 8  CREATININE 0.64 0.63  CALCIUM 8.8 8.3*   PT/INR No results found for this basename: LABPROT, INR,  in the last 72 hours  No results found for this basename: AST, ALT, ALKPHOS, BILITOT, PROT, ALBUMIN,  in the last 168 hours   Lipase     Component Value Date/Time   LIPASE 19 02/01/2013 1450     Studies/Results: Dg Abd 2 Views  03/08/2013   CLINICAL DATA:  Partial small bowel obstruction.  EXAM: ABDOMEN - 2 VIEW  COMPARISON:  Images from a small bowel follow-through study of February 06, 2013.  FINDINGS: Supine and left-side-down decubitus films of the abdomen are reviewed. There has been progression of the orally administered contrast through the mildly distended small bowel loops. There is contrast within the colon and rectum. There is exuberant sigmoid  diverticulosis. There remain numerous loops of mildly distended small bowel consistent with the clinically known partial small bowel obstruction. No extraluminal contrast is demonstrated.  IMPRESSION: The findings are consistent with a partial distal small bowel obstruction. Contrast is now present within the rectum. There is exuberant rectosigmoid diverticulosis.   Electronically Signed   By: David  [place]   On: 08-Mar-2013 11:19   Dg Abd Acute W/chest  02/08/2013   CLINICAL DATA:  Partial small bowel obstruction, abdominal pain.  EXAM: ACUTE ABDOMEN SERIES (ABDOMEN 2 VIEW & CHEST 1 VIEW)  COMPARISON:  03/08/2013  FINDINGS: Mild elevation of the right hemidiaphragm. Right base atelectasis. Left lung is clear. Heart is normal size.  Oral contrast material noted throughout the colon with extensive colonic diverticulosis, both in the sigmoid colon and at the hepatic flexure. Near complete clearance of contrast from the small bowel. Mildly prominent left and lower abdominal small bowel loops compatible with known partial small bowel obstruction. This may be slightly improved since prior study.  IMPRESSION: Suspect improvement in the partial small bowel obstruction.  Colonic diverticulosis.  Right base atelectasis.   Electronically Signed   By: Charlett Nose M.D.   On: 02/08/2013 08:27    Medications: . enoxaparin (LOVENOX) injection  40 mg Subcutaneous Q24H  . lactose free nutrition  237 mL Oral TID WC  . pantoprazole (PROTONIX) IV  40 mg Intravenous Q24H    Assessment/Plan  PSBO, S/P LIH with mesh 2001(admit 02/01/13)  Severe GE reflux and dysmotility  diverticulum of the proximal stomach   Plan:  She has been here with improvement and then reoccurring nausea for 8 days.  Contrast going thru, but in spite of this she is still have trouble with small volumes of liquids.  I will repeat her film this Am, and leave her on Full liquids, let her choose what she wants.  I have told her that is she does not  improve significantly we might consider taking her to the OR for laparoscopy in the next day or so. I will get another film today and recheck labs in AM  Make her NPO after MN.  Her family is in the room with her also and I have answered questions they currently have.  LOS: 8 days    Rudine Rieger 02/09/2013

## 2013-02-09 NOTE — Progress Notes (Signed)
Agree with PTA.    Darrelyn Morro, PT [phone]  

## 2013-02-09 NOTE — Progress Notes (Signed)
OT Cancellation Note and Discharge Note  Patient Details Name: Cathy Martinez MRN: [id number] DOB: 11-01-1931   Cancelled Treatment:    Reason Eval/Treat Not Completed: Other (comment). Pt doing much better with PT today and plan is now home. In to see pt for treatment and she reports that she did her own bath today and is getting up to bathroom by herself with only A for managing IV pole. Family and neighbor in room and they report that pt will not be alone for awhile due to the holidays. Pt is borrowing tub equipment and RW from family/friend. No further OT needs identified, will sign off.  Evette Georges [phone] 02/09/2013, 3:04 PM

## 2013-02-09 NOTE — Progress Notes (Addendum)
Physical Therapy Treatment Patient Details Name: Cathy Martinez MRN: [id number] DOB: 1931-11-07 Today's Date: 02/09/2013 Time: 0745-0809 PT Time Calculation (min): 24 min  PT Assessment / Plan / Recommendation  History of Present Illness Pt is a 79 y.o. female who lives alone and was adm secondary to small bowel obstruction.    PT Comments   Patient had a set back with nausea this weekend and was not discharged as plan. At this time patient is now wanting to return home vs. Going to rehab. Patient with have intermittent assistance from family and friends. Would like to attempt walking without RW and steps next session.   Follow Up Recommendations  Home health PT;Supervision - Intermittent     Does the patient have the potential to tolerate intense rehabilitation     Barriers to Discharge        Equipment Recommendations  Rolling walker with 5" wheels;3in1 (PT)    Recommendations for Other Services    Frequency Min 3X/week   Progress towards PT Goals Progress towards PT goals: Progressing toward goals  Plan Discharge plan needs to be updated    Precautions / Restrictions     Pertinent Vitals/Pain no apparent distress     Mobility  Bed Mobility Supine to Sit: 6: Modified independent (Device/Increase time) Sitting - Scoot to Edge of Bed: 6: Modified independent (Device/Increase time) Transfers Sit to Stand: 5: Supervision Stand to Sit: 5: Supervision Details for Transfer Assistance: supervision for safety. Cues not to pull up from Rw Ambulation/Gait Ambulation/Gait Assistance: 5: Supervision Ambulation Distance (Feet): 200 Feet Assistive device: Rolling walker Ambulation/Gait Assistance Details: steady use of RW.  Gait Pattern: Step-through pattern;Decreased stride length;Narrow base of support;Trunk flexed;Shuffle Gait velocity: decreased    Exercises     PT Diagnosis:    PT Problem List:   PT Treatment Interventions:     PT Goals (current goals can now be found  in the care plan section)    Visit Information  Last PT Received On: 02/09/13 Assistance Needed: +1 History of Present Illness: Pt is a 79 y.o. female who lives alone and was adm secondary to small bowel obstruction.     Subjective Data      Cognition  Cognition Arousal/Alertness: Awake/alert Behavior During Therapy: WFL for tasks assessed/performed Overall Cognitive Status: Within Functional Limits for tasks assessed    Balance     End of Session PT - End of Session Activity Tolerance: Patient tolerated treatment well Patient left: in chair;with call bell/phone within reach Nurse Communication: Mobility status;Precautions   GP     Fredrich Birks 02/09/2013, 8:16 AM  02/09/2013 Fredrich Birks PTA [phone] pager [phone] office

## 2013-02-09 NOTE — Progress Notes (Signed)
Patient ambulated the hallway with son this evening around 2100.  She enjoyed the walk and had no c/o any pain or discomfort.

## 2013-02-10 LAB — CBC
HCT: 37.7 % (ref 36.0–46.0)
Hemoglobin: 12.6 g/dL (ref 12.0–15.0)
MCV: 89.3 fL (ref 78.0–100.0)
RBC: 4.22 MIL/uL (ref 3.87–5.11)
RDW: 14.4 % (ref 11.5–15.5)
WBC: 6.3 10*3/uL (ref 4.0–10.5)

## 2013-02-10 LAB — BASIC METABOLIC PANEL
BUN: 9 mg/dL (ref 6–23)
CO2: 25 mEq/L (ref 19–32)
Chloride: 107 mEq/L (ref 96–112)
GFR calc Af Amer: >90 mL/min (ref >90)
Potassium: 4.7 mEq/L (ref 3.5–5.1)

## 2013-02-10 LAB — APTT: aPTT: 31 seconds (ref 24–37)

## 2013-02-10 LAB — PROTIME-INR: INR: 1.2 (ref 0.00–1.49)

## 2013-02-10 NOTE — Progress Notes (Signed)
Patient and her son expressed concerns regarding possible procedure this am (laparotomy) and using anesthesia.  Patient stated she has had complications with anesthesia in the past.

## 2013-02-10 NOTE — Progress Notes (Signed)
Slowly turning the corner.  Cathy Martinez. Cathy Bon, MD, FACS [phone] [facility] Surgery

## 2013-02-10 NOTE — Progress Notes (Signed)
Physical Therapy Treatment Patient Details Name: DALEIGH POLLINGER MRN: [id number] DOB: 10/25/1931 Today's Date: 02/10/2013 Time: 4098-1191 PT Time Calculation (min): 30 min  PT Assessment / Plan / Recommendation  History of Present Illness Pt is a 79 y.o. female who lives alone and was adm secondary to small bowel obstruction.    PT Comments   Patient progressing well with mobility. Walking with family. Educated on safety and energy conservation at home. Son present  Follow Up Recommendations  Home health PT;Supervision - Intermittent     Does the patient have the potential to tolerate intense rehabilitation     Barriers to Discharge        Equipment Recommendations  Rolling walker with 5" wheels;3in1 (PT)    Recommendations for Other Services    Frequency Min 3X/week   Progress towards PT Goals Progress towards PT goals: Progressing toward goals  Plan Discharge plan needs to be updated    Precautions / Restrictions Precautions Precautions: Fall   Pertinent Vitals/Pain no apparent distress     Mobility  Bed Mobility Supine to Sit: 6: Modified independent (Device/Increase time) Sitting - Scoot to Edge of Bed: 6: Modified independent (Device/Increase time) Transfers Sit to Stand: 6: Modified independent (Device/Increase time) Stand to Sit: 6: Modified independent (Device/Increase time) Ambulation/Gait Ambulation/Gait Assistance: 6: Modified independent (Device/Increase time) Ambulation Distance (Feet): 600 Feet Assistive device: Rolling walker Gait Pattern: Step-through pattern;Decreased stride length    Exercises     PT Diagnosis:    PT Problem List:   PT Treatment Interventions:     PT Goals (current goals can now be found in the care plan section)    Visit Information  Last PT Received On: 02/10/13 Assistance Needed: +1 History of Present Illness: Pt is a 79 y.o. female who lives alone and was adm secondary to small bowel obstruction.     Subjective  Data      Cognition  Cognition Arousal/Alertness: Awake/alert Behavior During Therapy: WFL for tasks assessed/performed Overall Cognitive Status: Within Functional Limits for tasks assessed    Balance     End of Session PT - End of Session Activity Tolerance: Patient tolerated treatment well Patient left: in chair;with call bell/phone within reach Nurse Communication: Mobility status;Precautions   GP     Fredrich Birks 02/10/2013, 2:25 PM  02/10/2013 Fredrich Birks PTA [phone] pager [phone] office

## 2013-02-10 NOTE — Progress Notes (Signed)
  Subjective: Had allot of clears yesterday, no nausea, no vomiting, + flatus, no BM.   Spent most of the day up in chair and walking with family.  Objective: Vital signs in last 24 hours: Temp:  [97.4 F (36.3 C)-98.5 F (36.9 C)] 98.5 F (36.9 C) (12/23 0621) Pulse Rate:  [75-79] 76 (12/23 0621) Resp:  [18-19] 19 (12/23 0621) BP: (101-130)/(50-58) 130/58 mmHg (12/23 0621) SpO2:  [97 %-100 %] 98 % (12/23 0621) Last BM Date: 02/08/13 I/O=?? Afebrile, VSS Labs OK Intake/Output from previous day:   Intake/Output this shift:    General appearance: alert, cooperative and no distress Resp: clear to auscultation bilaterally GI: soft, non-tender; bowel sounds normal; no masses,  no organomegaly  Lab Results:   Recent Labs  02/08/13 0545 02/10/13 0610  WBC 5.6 6.3  HGB 12.9 12.6  HCT 37.9 37.7  PLT 240 260    BMET  Recent Labs  02/09/13 0702 02/10/13 0610  NA 139 142  K 5.0 4.7  CL 105 107  CO2 24 25  GLUCOSE 80 75  BUN 9 9  CREATININE 0.72 0.67  CALCIUM 8.9 8.7   PT/INR  Recent Labs  02/10/13 0610  LABPROT 14.9  INR 1.20    No results found for this basename: AST, ALT, ALKPHOS, BILITOT, PROT, ALBUMIN,  in the last 168 hours   Lipase     Component Value Date/Time   LIPASE 19 02/01/2013 1450     Studies/Results: Dg Abd Acute W/chest  02/09/2013   CLINICAL DATA:  Abdominal pain, nausea, partial small bowel obstruction.  EXAM: ACUTE ABDOMEN SERIES (ABDOMEN 2 VIEW & CHEST 1 VIEW)  COMPARISON:  02/08/2013 and earlier  FINDINGS: The cardiomediastinal silhouette is within normal limits. Elevation of the right hemidiaphragm is unchanged. The patient has taken a slightly greater inspiration compared to the prior study. Mild linear opacity remains in the right lung base, likely atelectasis. Left lung remains clear. No pleural effusion is identified.  Oral contrast material remains throughout the colon with some interval clearing since the prior study. Colonic  diverticulosis is again noted. There remain multiple mildly prominent small bowel loops in the central and left upper abdomen, mildly improved from 02/08/2013 and significantly improved from 02/07/2013. Lumbar levoscoliosis is noted.  IMPRESSION: 1. Continued improvement of partial small bowel obstruction. 2. Minimal right basilar atelectasis.   Electronically Signed   By: Sebastian Ache   On: 02/09/2013 08:36    Medications: . enoxaparin (LOVENOX) injection  40 mg Subcutaneous Q24H  . lactose free nutrition  237 mL Oral TID WC  . pantoprazole (PROTONIX) IV  40 mg Intravenous Q24H    Assessment/Plan PSBO, S/P LIH with mesh 2001(admit 02/01/13)  Severe GE reflux and dysmotility  diverticulum of the proximal stomach    Plan:  Full liquids and advance to low fiber at supper if she does well.     LOS: 9 days    Cathy Martinez 02/10/2013

## 2013-02-11 MED ORDER — PANTOPRAZOLE SODIUM 40 MG PO TBEC
40.0000 mg | DELAYED_RELEASE_TABLET | Freq: Every day | ORAL | Status: DC
  Administered 2013-02-11: 40 mg via ORAL
  Filled 2013-02-11: qty 1

## 2013-02-11 MED ORDER — SORBITOL 70 % SOLN
960.0000 mL | TOPICAL_OIL | Freq: Once | ORAL | Status: AC
  Administered 2013-02-11: 960 mL via RECTAL
  Filled 2013-02-11: qty 240

## 2013-02-11 NOTE — Progress Notes (Signed)
Patient ID: Cathy Martinez, female   DOB: 02/27/1931, 79 y.o.   MRN: [id number]    Subjective: No complaints this morning. She had solid food for dinner and denies any pain or nausea or bloating. She has flatus but is concerned that she has not yet had a bowel movement.  Objective: Vital signs in last 24 hours: Temp:  [97.4 F (36.3 C)-98.1 F (36.7 C)] 97.9 F (36.6 C) (12/24 0539) Pulse Rate:  [72-82] 82 (12/24 0539) Resp:  [18] 18 (12/24 0539) BP: (93-133)/(46-62) 124/62 mmHg (12/24 0539) SpO2:  [100 %] 100 % (12/24 0539) Last BM Date: 02/08/13  Intake/Output from previous day: 12/23 0701 - 12/24 0700 In: 3349.8 [I.V.:3349.8] Out: -  Intake/Output this shift:    General appearance: alert, cooperative and no distress GI: normal findings: soft, non-tender and nondistended  Lab Results:   Recent Labs  02/10/13 0610  WBC 6.3  HGB 12.6  HCT 37.7  PLT 260   BMET  Recent Labs  02/09/13 0702 02/10/13 0610  NA 139 142  K 5.0 4.7  CL 105 107  CO2 24 25  GLUCOSE 80 75  BUN 9 9  CREATININE 0.72 0.67  CALCIUM 8.9 8.7     Studies/Results: Dg Abd Acute W/chest  02/09/2013   CLINICAL DATA:  Abdominal pain, nausea, partial small bowel obstruction.  EXAM: ACUTE ABDOMEN SERIES (ABDOMEN 2 VIEW & CHEST 1 VIEW)  COMPARISON:  02/08/2013 and earlier  FINDINGS: The cardiomediastinal silhouette is within normal limits. Elevation of the right hemidiaphragm is unchanged. The patient has taken a slightly greater inspiration compared to the prior study. Mild linear opacity remains in the right lung base, likely atelectasis. Left lung remains clear. No pleural effusion is identified.  Oral contrast material remains throughout the colon with some interval clearing since the prior study. Colonic diverticulosis is again noted. There remain multiple mildly prominent small bowel loops in the central and left upper abdomen, mildly improved from 02/08/2013 and significantly improved from  02/07/2013. Lumbar levoscoliosis is noted.  IMPRESSION: 1. Continued improvement of partial small bowel obstruction. 2. Minimal right basilar atelectasis.   Electronically Signed   By: Sebastian Ache   On: 02/09/2013 08:36    Anti-infectives: Anti-infectives   None      Assessment/Plan: Doing well likely resolved small bowel obstruction. She is concerned that she has not had a bowel movement. We will try him an enema this morning. If her bowels move it she is tolerating her diet this afternoon she could go home at that time.    LOS: 10 days    Annebelle Bostic T 02/11/2013

## 2013-02-11 NOTE — Progress Notes (Signed)
Paged MD on call, informed MD of pt.'s nausea/vomiting episode and her feeling of continued constipation.  MD gave order to change diet to CL and hold off on the discharge process. Pt. Informed of plan. Will continue to monitor. Cathy Martinez

## 2013-02-11 NOTE — Progress Notes (Signed)
Pt. Able to have BM after enema.  Complaining of stomach cramping/nausea.  Gave zofran.  Nausea has subsided but pt. Says she "still feels like there is something in there."  Pt. Still trying to have another BM.  Will continue to monitor. Vanice Sarah

## 2013-02-12 ENCOUNTER — Inpatient Hospital Stay (HOSPITAL_COMMUNITY): Payer: Medicare Other

## 2013-02-12 MED ORDER — SORBITOL 70 % SOLN: 960.0000 mL | TOPICAL_OIL | Freq: Once | ORAL | Status: DC

## 2013-02-12 MED ORDER — PANTOPRAZOLE SODIUM 40 MG IV SOLR
40.0000 mg | INTRAVENOUS | Status: DC
  Administered 2013-02-12: 40 mg via INTRAVENOUS
  Filled 2013-02-12 (×2): qty 40

## 2013-02-12 MED ORDER — SORBITOL 70 % SOLN
960.0000 mL | TOPICAL_OIL | Freq: Once | ORAL | Status: AC
  Administered 2013-02-12: 960 mL via RECTAL
  Filled 2013-02-12: qty 240

## 2013-02-12 NOTE — Progress Notes (Signed)
Pt unable to tolerate entire bottle of SMOG. Had moderate amt of light colored (barium?) stool. No complaint of nausea after eating low fiber lunch, but refused dinner. No episode of vomiting today.Cathy Martinez

## 2013-02-12 NOTE — Progress Notes (Signed)
  Subjective: Vomited breakfast, got an enema yesterday with good results, feeling OK now on clears.  Objective: Vital signs in last 24 hours: Temp:  [97.9 F (36.6 C)-98.1 F (36.7 C)] 97.9 F (36.6 C) (12/25 0544) Pulse Rate:  [61-82] 61 (12/25 0544) Resp:  [18] 18 (12/25 0544) BP: (102-133)/(45-64) 102/45 mmHg (12/25 0544) SpO2:  [97 %-100 %] 100 % (12/25 0544) Last BM Date: 02/11/13 3 BM's yesterday after enema Afebrile,VSS Labs 02/10/13 OK Intake/Output from previous day: 12/24 0701 - 12/25 0700 In: 2000.2 [I.V.:2000.2] Out: -  Intake/Output this shift:    General appearance: alert, cooperative and no distress Resp: clear to auscultation bilaterally GI: soft, non-tender; bowel sounds normal; no masses,  no organomegaly  Lab Results:   Recent Labs  02/10/13 0610  WBC 6.3  HGB 12.6  HCT 37.7  PLT 260    BMET  Recent Labs  02/10/13 0610  NA 142  K 4.7  CL 107  CO2 25  GLUCOSE 75  BUN 9  CREATININE 0.67  CALCIUM 8.7   PT/INR  Recent Labs  02/10/13 0610  LABPROT 14.9  INR 1.20    No results found for this basename: AST, ALT, ALKPHOS, BILITOT, PROT, ALBUMIN,  in the last 168 hours   Lipase     Component Value Date/Time   LIPASE 19 02/01/2013 1450     Studies/Results: No results found.  Medications: . enoxaparin (LOVENOX) injection  40 mg Subcutaneous Q24H  . lactose free nutrition  237 mL Oral TID WC  . pantoprazole  40 mg Oral Daily    Assessment/Plan PSBO, S/P LIH with mesh 2001(admit 02/01/13)  Severe GE reflux and dysmotility  diverticulum of the proximal stomach    Plan:  Recheck films and if ok try low fiber diet, if not consider surgery in AM.   LOS: 11 days    Carollee Nussbaumer 02/12/2013

## 2013-02-12 NOTE — Progress Notes (Signed)
Will check KUB/abdominal X-rays and if okay will advance to soft diet.  If she fails the will likely take to the OR tomorrow AM.  Marta Lamas. Gae Bon, MD, FACS [phone] [facility]

## 2013-02-13 MED ORDER — POLYETHYLENE GLYCOL 3350 17 GM/SCOOP PO POWD: 8.5000 g | Freq: Two times a day (BID) | ORAL | Status: DC | PRN

## 2013-02-13 MED ORDER — OMEPRAZOLE 40 MG PO CPDR: 40.0000 mg | DELAYED_RELEASE_CAPSULE | Freq: Every day | ORAL | Status: DC

## 2013-02-13 MED ORDER — ONDANSETRON 4 MG PO TBDP: 4.0000 mg | ORAL_TABLET | Freq: Three times a day (TID) | ORAL | Status: DC | PRN

## 2013-02-13 NOTE — Discharge Summary (Signed)
Physician Discharge Summary  Patient ID: Cathy Martinez MRN: [id number] DOB/AGE: October 11, 1931 79 y.o.  Admit date: 02/01/2013 Discharge date: 02/13/2013  Admitting Diagnosis: Partial small bowel obstruction Nausea/vomiting Abdominal pain GERD Bleeding hemorrhoids Diverticular disease   Discharge Diagnosis Patient Active Problem List   Diagnosis Date Noted  . Small bowel obstruction due to adhesions 02/01/2013    Consultants Dr. Dulce Sellar (outpatient consultation)  Imaging: Dg Abd 2 Views  02/12/2013   CLINICAL DATA:  History of constipation.  EXAM: ABDOMEN - 2 VIEW  COMPARISON:  Acute abdominal series of February 09, 2013.  FINDINGS: On today's study the bowel gas pattern is fairly nonspecific. There is a few loops of mildly distended gas-filled small bowel in the left upper quadrant of the abdomen similar to that seen on the earlier study. Contrast is present within the distal ascending colon is as well as the transverse and proximal descending colon and there has been some further distal migration of the contrast. No free extraluminal gas collections are demonstrated. Numerous diverticular present throughout the colon. There is a persistent small contrast collection in the right upper quadrant of the abdomen overlying the medial aspect of the left 12th rib which appears stable.  IMPRESSION: There has not been significant interval change in the appearance of the small enlarged bowel gas pattern since the previous study. No free extraluminal gas collections are demonstrated.   Electronically Signed   By: David  [place]   On: 02/12/2013 09:17    Procedures None  Hospital Course:  79 yo female presents with four day history of abdominal distention, nausea, and vomiting.  She presented to [facility] for further evaluation.  She last had a normal bowel movement on Tuesday 6 days prior to admission. Her abdomen has become fairly firm, and she has been vomiting up undigested food. No hematemesis.  She was unable to tolerate even the oral contrast for the CT scan. She reports that she passed a small "rabbit pellet" of stool yesterday.  Workup showed partial small bowel obstruction.  Patient was admitted for conservative management for her bowel obstruction.  Shortly after admission she began to have bowel function.  We started a bowel regimen.  Diet was advanced as tolerated.  However, upon advancing to solid food her symptoms of N/V returned and thus she was backed off to clear liquids again.  A UGI with SBFT with barium was obtained which was consistent with a partial SBO.  She continued to have multiple BM's per day which led [place] to believe that she was no longer obstructed.  We re-advanced her multiple times with some success and some relapse and multiple repeat KUB's showed nonobstructive patterns and her exam remained soft and unconcerning for returning bowel obstruction or ileus.  Physical therapy/occupational therapy were consulted and continue to work with the patient throughout her hospital stay.  They recommend HH PT/OT at home which will be arranged.  On HD #13, the patient was voiding well, tolerating low fiber diet, ambulating well, pain well controlled, vital signs stable, and felt stable for discharge home with home supervision of her family.  Patient will follow up in our office as needed and knows to call with questions or concerns.  We arranged for an OP f/u with Dr. Dulce Sellar for a colonoscopy and EGD as she has not had either.  She was educated on diet instructions for low/high fiber diet timing as well as GERD and diverticular disease diets.  She is encouraged to take prilosec and miralax at  discharge.   Physical Exam: General:  Alert, NAD, pleasant, comfortable Abd:  Soft, thin, ND, mild tenderness, no HSM.    Medication List         albuterol 108 (90 BASE) MCG/ACT inhaler  Commonly known as:  PROVENTIL HFA;VENTOLIN HFA  Inhale 1 puff into the lungs every 6 (six) hours as  needed for wheezing or shortness of breath.     carboxymethylcellulose 0.5 % Soln  Commonly known as:  REFRESH PLUS  Place 1 drop into both eyes 3 (three) times daily as needed (eye irritation).     docusate sodium 50 MG capsule  Commonly known as:  COLACE  Take 50 mg by mouth daily as needed for mild constipation.     Fish Oil Oil  Take 1 capsule by mouth daily.     multivitamin with minerals tablet  Take 1 tablet by mouth daily.     omeprazole 40 MG capsule  Commonly known as:  PRILOSEC  Take 1 capsule (40 mg total) by mouth daily.     ondansetron 4 MG disintegrating tablet  Commonly known as:  ZOFRAN ODT  Take 1 tablet (4 mg total) by mouth every 8 (eight) hours as needed for nausea or vomiting.     polyethylene glycol powder powder  Commonly known as:  MIRALAX  Take 8.5-34 g by mouth 2 (two) times daily as needed for mild constipation or moderate constipation. To correct constipation.  Adjust dose over 1-2 months.  Goal = ~1 bowel movement / day     VITAMIN C GUMMIE PO  Take 1 tablet by mouth daily.             Follow-up Information   Follow up with Aida Puffer, MD. (As needed)    Specialty:  Family Medicine   Contact information:   [address] [place] [address] [phone]       Follow up with Freddy Jaksch, MD On 02/25/2013. (4:30pm arrive for your appointment, bring photo ID, insurance card, and list of mecications)    Specialty:  Gastroenterology   Contact information:   [address], [address] [place] [address] [phone]       Follow up with CCS,MD, MD. (CALL as needed if symptoms worsen)    Specialty:  General Surgery   Contact information:   [address] [place] [address] [phone]       Follow up with Freddy Jaksch, MD. (Colonoscopy and Endoscopy to be decided upon after your appointment with Dr. Dulce Sellar)    Specialty:  Gastroenterology   Contact information:   [address], [address] [place] [address] [phone]       Signed: Candiss Norse [facility] [phone]  02/13/2013, 9:25 AM

## 2013-02-13 NOTE — Progress Notes (Signed)
0005 Patient up to the [facility] had a small blood tinge mucus stool. Son stated that she had had four prior to this one looking the same.

## 2013-02-13 NOTE — Progress Notes (Signed)
Discharge home. Home discharge instruction given to patient and daughter in law, no question verbalized.

## 2013-03-12 ENCOUNTER — Encounter (HOSPITAL_COMMUNITY): Payer: Self-pay | Admitting: Pharmacy Technician

## 2013-03-13 ENCOUNTER — Other Ambulatory Visit (HOSPITAL_COMMUNITY): Payer: Self-pay | Admitting: Orthopedic Surgery

## 2013-03-13 NOTE — Patient Instructions (Addendum)
[place]  03/13/2013   Your procedure is scheduled on: Monday February 2nd  Report to [facility] at 1000 AM.  Call this number if you have problems the morning of surgery [phone]   Remember: please bring inhaler on day of surgery   Do not eat food or drink liquids :After Midnight.     Take these medicines the morning of surgery with A SIP OF WATER: albuterol, omeprazole if needed, eye drops if needed                                [place] may not have any metal on your body including hair pins and piercings  Do not wear jewelry, make-up.  Do not wear lotions, powders, or perfumes. You may wear deodorant.   Men may shave face and neck.  Do not bring valuables to the hospital. [place].  Contacts, dentures or bridgework may not be worn into surgery.  Leave suitcase in the car. After surgery it may be brought to your room.  For patients admitted to the hospital, checkout time is 11:00 AM the day of discharge.    Please read over the following fact sheets that you were given: [facility] Preparing for surgery sheet, mrsa information, incentive spirometer sheet, blood fact sheet  Call Paulette Blanch RN pre op nurse if needed [place].  PATIENT SIGNATURE___________________________________________  NURSE SIGNATURE_____________________________________________

## 2013-03-13 NOTE — Progress Notes (Signed)
Anesthesia record 08-25-1999 [place] on patient chart Dg abd acute with chest 1 view 02-09-13 epic ekg 02-01-13 on chart Left sherry wills message clearance faxed by dr little says patient needs to return to office, please fax completed clearance if one available.

## 2013-03-16 ENCOUNTER — Encounter (HOSPITAL_COMMUNITY)
Admission: RE | Admit: 2013-03-16 | Discharge: 2013-03-16 | Disposition: A | Discharge status: Home / Self Care | Payer: Medicare HMO | Source: Ambulatory Visit | Attending: Orthopedic Surgery | Admitting: Orthopedic Surgery

## 2013-03-16 ENCOUNTER — Encounter (INDEPENDENT_AMBULATORY_CARE_PROVIDER_SITE_OTHER): Payer: Self-pay

## 2013-03-16 ENCOUNTER — Encounter (HOSPITAL_COMMUNITY): Payer: Self-pay

## 2013-03-16 DIAGNOSIS — Z01818 Encounter for other preprocedural examination: Secondary | ICD-10-CM | POA: Insufficient documentation

## 2013-03-16 DIAGNOSIS — Z01812 Encounter for preprocedural laboratory examination: Secondary | ICD-10-CM | POA: Insufficient documentation

## 2013-03-16 HISTORY — DX: Gastro-esophageal reflux disease without esophagitis: K21.9

## 2013-03-16 HISTORY — DX: Pneumonia, unspecified organism: J18.9

## 2013-03-16 HISTORY — DX: Personal history of other diseases of the respiratory system: Z87.09

## 2013-03-16 HISTORY — DX: Nausea with vomiting, unspecified: R11.2

## 2013-03-16 HISTORY — DX: Rheumatoid arthritis, unspecified: M06.9

## 2013-03-16 HISTORY — DX: Sciatica, unspecified side: M54.30

## 2013-03-16 HISTORY — DX: Personal history of other diseases of the digestive system: Z87.19

## 2013-03-16 HISTORY — DX: Diverticulosis of intestine, part unspecified, without perforation or abscess without bleeding: K57.90

## 2013-03-16 HISTORY — DX: Other specified postprocedural states: Z98.890

## 2013-03-16 LAB — PROTIME-INR
INR: 0.97 (ref 0.00–1.49)
Prothrombin Time: 12.7 seconds (ref 11.6–15.2)

## 2013-03-16 LAB — BASIC METABOLIC PANEL
BUN: 16 mg/dL (ref 6–23)
CALCIUM: 9.5 mg/dL (ref 8.4–10.5)
CO2: 26 mEq/L (ref 19–32)
CREATININE: 0.72 mg/dL (ref 0.50–1.10)
Chloride: 105 mEq/L (ref 96–112)
GFR calc non Af Amer: 80 mL/min — ABNORMAL LOW (ref >90)
Glucose, Bld: 96 mg/dL (ref 70–99)
Potassium: 5.1 mEq/L (ref 3.7–5.3)
Sodium: 143 mEq/L (ref 137–147)

## 2013-03-16 LAB — URINE MICROSCOPIC-ADD ON

## 2013-03-16 LAB — URINALYSIS, ROUTINE W REFLEX MICROSCOPIC
BILIRUBIN URINE: NEGATIVE
Glucose, UA: NEGATIVE mg/dL
Hgb urine dipstick: NEGATIVE
KETONES UR: NEGATIVE mg/dL
Nitrite: NEGATIVE
PH: 6 (ref 5.0–8.0)
Protein, ur: NEGATIVE mg/dL
Specific Gravity, Urine: 1.016 (ref 1.005–1.030)
Urobilinogen, UA: 0.2 mg/dL (ref 0.0–1.0)

## 2013-03-16 LAB — CBC
HCT: 43 % (ref 36.0–46.0)
Hemoglobin: 14.2 g/dL (ref 12.0–15.0)
MCH: 30.5 pg (ref 26.0–34.0)
MCHC: 33 g/dL (ref 30.0–36.0)
MCV: 92.5 fL (ref 78.0–100.0)
PLATELETS: 243 10*3/uL (ref 150–400)
RBC: 4.65 MIL/uL (ref 3.87–5.11)
RDW: 15.6 % — AB (ref 11.5–15.5)
WBC: 8.1 10*3/uL (ref 4.0–10.5)

## 2013-03-16 LAB — SURGICAL PCR SCREEN
MRSA, PCR: NEGATIVE
STAPHYLOCOCCUS AUREUS: NEGATIVE

## 2013-03-16 LAB — APTT: APTT: 29 s (ref 24–37)

## 2013-03-16 LAB — ABO/RH: ABO/RH(D): O POS

## 2013-03-16 NOTE — Progress Notes (Addendum)
Pt requests IV nurse for day of surgery. Pt "was stuck multiple times last time before the IV nurse was called". Also, pt "does not want to know anything about the operation. " Please address.

## 2013-03-19 NOTE — Progress Notes (Signed)
Surgery clearance note Dr. Rex Kras on chart

## 2013-03-22 NOTE — H&P (Signed)
TOTAL KNEE ADMISSION H&P  Patient is being admitted for right total knee arthroplasty.  Subjective:  Chief Complaint:     Right knee OA / pain.  HPI: Cathy Martinez, 80 y.o. female, has a history of pain and functional disability in the right knee due to arthritis and has failed non-surgical conservative treatments for greater than 12 weeks to includeNSAID's and/or analgesics, corticosteriod injections, use of assistive devices and activity modification.  Onset of symptoms was gradual, starting 2+ years ago with gradually worsening course since that time. The patient noted no past surgery on the right knee(s).  Patient currently rates pain in the right knee(s) at 8 out of 10 with activity. Patient has worsening of pain with activity and weight bearing, pain that interferes with activities of daily living, pain with passive range of motion, crepitus and joint swelling.  Patient has evidence of periarticular osteophytes and joint space narrowing by imaging studies.  There is no active infection.  Risks, benefits and expectations were discussed with the patient.  Risks including but not limited to the risk of anesthesia, blood clots, nerve damage, blood vessel damage, failure of the prosthesis, infection and up to and including death.  Patient understand the risks, benefits and expectations and wishes to proceed with surgery.   D/C Plans:   SNF - [facility]  Post-op Meds:     No Rx given  Tranexamic Acid:   To be given  Decadron:    To be given  FYI:    ASA post-op  Norco post-op  Needs high fiber diet, no nuts and no seeds    Patient Active Problem List   Diagnosis Date Noted  . Small bowel obstruction due to adhesions 02/01/2013   Past Medical History  Diagnosis Date  . Asthma     very rare  . H/O small bowel obstruction 2014  . Diverticulosis   . H/O bronchitis   . Pneumonia     hx of 3 years ago  . Sciatica   . GERD (gastroesophageal reflux disease)     occasional  . Rheumatoid  arthritis   . PONV (postoperative nausea and vomiting)     Past Surgical History  Procedure Laterality Date  . Abdominal hysterectomy    . Cataract extraction Bilateral 5 years ago  . Breast biopsy  15-20 years ago  . Hernia repair  12 years ago  . Tonsillectomy  as child    No prescriptions prior to admission   No Known Allergies   History  Substance Use Topics  . Smoking status: Never Smoker   . Smokeless tobacco: Never Used  . Alcohol Use: Yes     Comment: rare    No family history on file.   Review of Systems  Constitutional: Negative.   HENT: Negative.   Eyes: Negative.   Respiratory: Negative.   Cardiovascular: Negative.   Gastrointestinal: Positive for heartburn and constipation.  Genitourinary: Negative.   Musculoskeletal: Positive for joint pain.  Skin: Negative.   Neurological: Negative.   Endo/Heme/Allergies: Negative.   Psychiatric/Behavioral: Negative.     Objective:  Physical Exam  Constitutional: She is oriented to person, place, and time. She appears well-developed and well-nourished.  HENT:  Head: Normocephalic and atraumatic.  Mouth/Throat: Oropharynx is clear and moist.  Eyes: Pupils are equal, round, and reactive to light.  Neck: Neck supple. No JVD present. No tracheal deviation present. No thyromegaly present.  Cardiovascular: Normal rate, regular rhythm, normal heart sounds and intact distal pulses.  Respiratory: Effort normal and breath sounds normal. No stridor. No respiratory distress. She has no wheezes.  GI: Soft. There is no tenderness. There is no guarding.  Musculoskeletal:       Right knee: She exhibits decreased range of motion, swelling and bony tenderness. She exhibits no effusion, no ecchymosis, no deformity, no laceration and no erythema. Tenderness found.  Lymphadenopathy:    She has no cervical adenopathy.  Neurological: She is alert and oriented to person, place, and time.  Skin: Skin is warm and dry.  Psychiatric: She  has a normal mood and affect.     Labs:  Estimated body mass index is 22.78 kg/(m^2) as calculated from the following:   Height as of 02/01/13: 5' (1.524 m).   Weight as of 02/01/13: 52.9 kg (116 lb 10 oz).   Imaging Review Plain radiographs demonstrate severe degenerative joint disease of the right knee(s). The overall alignment is  neutral. The bone quality appears to be good for age and reported activity level.  Assessment/Plan:  End stage arthritis, right knee   The patient history, physical examination, clinical judgment of the provider and imaging studies are consistent with end stage degenerative joint disease of the right knee(s) and total knee arthroplasty is deemed medically necessary. The treatment options including medical management, injection therapy arthroscopy and arthroplasty were discussed at length. The risks and benefits of total knee arthroplasty were presented and reviewed. The risks due to aseptic loosening, infection, stiffness, patella tracking problems, thromboembolic complications and other imponderables were discussed. The patient acknowledged the explanation, agreed to proceed with the plan and consent was signed. Patient is being admitted for inpatient treatment for surgery, pain control, PT, OT, prophylactic antibiotics, VTE prophylaxis, progressive ambulation and ADL's and discharge planning. The patient is planning to be discharged to skilled nursing facility.     Cathy Martinez Keri Veale   PAC  03/22/2013, 12:40 PM

## 2013-03-23 ENCOUNTER — Inpatient Hospital Stay (HOSPITAL_COMMUNITY)
Admission: RE | Admit: 2013-03-23 | Discharge: 2013-03-25 | DRG: 470 | Disposition: A | Discharge status: Skilled Nursing Facility | Payer: Medicare HMO | Source: Ambulatory Visit | Attending: Orthopedic Surgery | Admitting: Orthopedic Surgery

## 2013-03-23 ENCOUNTER — Encounter (HOSPITAL_COMMUNITY)
Admission: RE | Disposition: A | Discharge status: Skilled Nursing Facility | Payer: Self-pay | Source: Ambulatory Visit | Attending: Orthopedic Surgery

## 2013-03-23 ENCOUNTER — Encounter (HOSPITAL_COMMUNITY): Payer: Self-pay | Admitting: *Deleted

## 2013-03-23 ENCOUNTER — Inpatient Hospital Stay (HOSPITAL_COMMUNITY): Payer: Medicare HMO | Admitting: Anesthesiology

## 2013-03-23 ENCOUNTER — Encounter (HOSPITAL_COMMUNITY): Payer: Medicare HMO | Admitting: Anesthesiology

## 2013-03-23 DIAGNOSIS — Z96659 Presence of unspecified artificial knee joint: Secondary | ICD-10-CM

## 2013-03-23 DIAGNOSIS — J45909 Unspecified asthma, uncomplicated: Secondary | ICD-10-CM | POA: Diagnosis present

## 2013-03-23 DIAGNOSIS — M6580 Other synovitis and tenosynovitis, unspecified site: Secondary | ICD-10-CM | POA: Diagnosis present

## 2013-03-23 DIAGNOSIS — D62 Acute posthemorrhagic anemia: Secondary | ICD-10-CM | POA: Diagnosis not present

## 2013-03-23 DIAGNOSIS — K219 Gastro-esophageal reflux disease without esophagitis: Secondary | ICD-10-CM | POA: Diagnosis present

## 2013-03-23 DIAGNOSIS — M1710 Unilateral primary osteoarthritis, unspecified knee: Principal | ICD-10-CM | POA: Diagnosis present

## 2013-03-23 DIAGNOSIS — M069 Rheumatoid arthritis, unspecified: Secondary | ICD-10-CM | POA: Diagnosis present

## 2013-03-23 HISTORY — PX: TOTAL KNEE ARTHROPLASTY: SHX125

## 2013-03-23 LAB — TYPE AND SCREEN
ABO/RH(D): O POS
Antibody Screen: NEGATIVE

## 2013-03-23 SURGERY — ARTHROPLASTY, KNEE, TOTAL
Anesthesia: Spinal | Site: Knee | Laterality: Right

## 2013-03-23 MED ORDER — SODIUM CHLORIDE 0.9 % IJ SOLN
INTRAMUSCULAR | Status: AC
  Filled 2013-03-23: qty 10

## 2013-03-23 MED ORDER — HYDROMORPHONE HCL PF 1 MG/ML IJ SOLN
INTRAMUSCULAR | Status: AC
  Filled 2013-03-23: qty 1

## 2013-03-23 MED ORDER — BUPIVACAINE LIPOSOME 1.3 % IJ SUSP
20.0000 mL | Freq: Once | INTRAMUSCULAR | Status: DC
Start: 2013-03-23 — End: 2013-03-23
  Filled 2013-03-23: qty 20

## 2013-03-23 MED ORDER — BUPIVACAINE HCL (PF) 0.75 % IJ SOLN
INTRAMUSCULAR | Status: DC | PRN
  Administered 2013-03-23: 12 mg via INTRATHECAL

## 2013-03-23 MED ORDER — ALUM & MAG HYDROXIDE-SIMETH 200-200-20 MG/5ML PO SUSP
30.0000 mL | ORAL | Status: DC | PRN
Start: 2013-03-23 — End: 2013-03-25

## 2013-03-23 MED ORDER — CELECOXIB 200 MG PO CAPS
200.0000 mg | ORAL_CAPSULE | Freq: Two times a day (BID) | ORAL | Status: DC
  Administered 2013-03-23 – 2013-03-25 (×4): 200 mg via ORAL
  Filled 2013-03-23 (×5): qty 1

## 2013-03-23 MED ORDER — ONDANSETRON HCL 4 MG/2ML IJ SOLN
INTRAMUSCULAR | Status: AC
  Filled 2013-03-23: qty 2

## 2013-03-23 MED ORDER — ZOLPIDEM TARTRATE 5 MG PO TABS: 5.0000 mg | ORAL_TABLET | Freq: Every evening | ORAL | Status: DC | PRN

## 2013-03-23 MED ORDER — PROPOFOL 10 MG/ML IV BOLUS
INTRAVENOUS | Status: AC
  Filled 2013-03-23: qty 20

## 2013-03-23 MED ORDER — METHOCARBAMOL 100 MG/ML IJ SOLN
500.0000 mg | Freq: Four times a day (QID) | INTRAVENOUS | Status: DC | PRN
  Administered 2013-03-23: 500 mg via INTRAVENOUS
  Filled 2013-03-23: qty 5

## 2013-03-23 MED ORDER — ACYCLOVIR 200 MG PO CAPS
200.0000 mg | ORAL_CAPSULE | Freq: Two times a day (BID) | ORAL | Status: DC
  Administered 2013-03-23 – 2013-03-25 (×4): 200 mg via ORAL
  Filled 2013-03-23 (×5): qty 1

## 2013-03-23 MED ORDER — ASPIRIN EC 325 MG PO TBEC
325.0000 mg | DELAYED_RELEASE_TABLET | Freq: Two times a day (BID) | ORAL | Status: DC
  Administered 2013-03-24 – 2013-03-25 (×3): 325 mg via ORAL
  Filled 2013-03-23 (×5): qty 1

## 2013-03-23 MED ORDER — MIDAZOLAM HCL 2 MG/2ML IJ SOLN
INTRAMUSCULAR | Status: AC
  Filled 2013-03-23: qty 2

## 2013-03-23 MED ORDER — POLYVINYL ALCOHOL 1.4 % OP SOLN
1.0000 [drp] | Freq: Three times a day (TID) | OPHTHALMIC | Status: DC | PRN
  Filled 2013-03-23: qty 15

## 2013-03-23 MED ORDER — LACTATED RINGERS IV SOLN: INTRAVENOUS | Status: DC

## 2013-03-23 MED ORDER — METOCLOPRAMIDE HCL 5 MG/ML IJ SOLN: 5.0000 mg | Freq: Three times a day (TID) | INTRAMUSCULAR | Status: DC | PRN

## 2013-03-23 MED ORDER — DEXAMETHASONE SODIUM PHOSPHATE 10 MG/ML IJ SOLN
10.0000 mg | Freq: Once | INTRAMUSCULAR | Status: AC
  Administered 2013-03-23: 10 mg via INTRAVENOUS

## 2013-03-23 MED ORDER — CEFAZOLIN SODIUM-DEXTROSE 2-3 GM-% IV SOLR
INTRAVENOUS | Status: AC
  Filled 2013-03-23: qty 50

## 2013-03-23 MED ORDER — BISACODYL 10 MG RE SUPP: 10.0000 mg | Freq: Every day | RECTAL | Status: DC | PRN

## 2013-03-23 MED ORDER — PHENOL 1.4 % MT LIQD
1.0000 | OROMUCOSAL | Status: DC | PRN
  Filled 2013-03-23: qty 177

## 2013-03-23 MED ORDER — MENTHOL 3 MG MT LOZG
1.0000 | LOZENGE | OROMUCOSAL | Status: DC | PRN
  Filled 2013-03-23: qty 9

## 2013-03-23 MED ORDER — BUPIVACAINE-EPINEPHRINE (PF) 0.25% -1:200000 IJ SOLN
INTRAMUSCULAR | Status: DC | PRN
  Administered 2013-03-23: 25 mL

## 2013-03-23 MED ORDER — CEFAZOLIN SODIUM-DEXTROSE 2-3 GM-% IV SOLR
2.0000 g | INTRAVENOUS | Status: AC
  Administered 2013-03-23: 2 g via INTRAVENOUS

## 2013-03-23 MED ORDER — CARBOXYMETHYLCELLULOSE SODIUM 0.5 % OP SOLN: 1.0000 [drp] | Freq: Three times a day (TID) | OPHTHALMIC | Status: DC | PRN

## 2013-03-23 MED ORDER — SODIUM CHLORIDE 0.9 % IV SOLN
INTRAVENOUS | Status: DC
  Administered 2013-03-23: 23:00:00 via INTRAVENOUS
  Filled 2013-03-23 (×6): qty 1000

## 2013-03-23 MED ORDER — POLYETHYLENE GLYCOL 3350 17 G PO PACK
17.0000 g | PACK | Freq: Two times a day (BID) | ORAL | Status: DC
  Administered 2013-03-23 – 2013-03-24 (×2): 17 g via ORAL

## 2013-03-23 MED ORDER — LACTATED RINGERS IV SOLN
INTRAVENOUS | Status: DC | PRN
  Administered 2013-03-23 (×2): via INTRAVENOUS

## 2013-03-23 MED ORDER — LIDOCAINE HCL (CARDIAC) 20 MG/ML IV SOLN
INTRAVENOUS | Status: AC
  Filled 2013-03-23: qty 5

## 2013-03-23 MED ORDER — MIDAZOLAM HCL 5 MG/5ML IJ SOLN
INTRAMUSCULAR | Status: DC | PRN
  Administered 2013-03-23 (×2): 1 mg via INTRAVENOUS

## 2013-03-23 MED ORDER — 0.9 % SODIUM CHLORIDE (POUR BTL) OPTIME
TOPICAL | Status: DC | PRN
  Administered 2013-03-23: 1000 mL

## 2013-03-23 MED ORDER — ALBUTEROL SULFATE (2.5 MG/3ML) 0.083% IN NEBU: 3.0000 mL | INHALATION_SOLUTION | Freq: Four times a day (QID) | RESPIRATORY_TRACT | Status: DC | PRN

## 2013-03-23 MED ORDER — DOCUSATE SODIUM 100 MG PO CAPS
100.0000 mg | ORAL_CAPSULE | Freq: Two times a day (BID) | ORAL | Status: DC
  Administered 2013-03-23 – 2013-03-24 (×3): 100 mg via ORAL

## 2013-03-23 MED ORDER — SODIUM CHLORIDE 0.9 % IJ SOLN
INTRAMUSCULAR | Status: AC
  Filled 2013-03-23: qty 50

## 2013-03-23 MED ORDER — SODIUM CHLORIDE 0.9 % IR SOLN
Status: DC | PRN
  Administered 2013-03-23: 1000 mL

## 2013-03-23 MED ORDER — FLEET ENEMA 7-19 GM/118ML RE ENEM: 1.0000 | ENEMA | Freq: Once | RECTAL | Status: AC | PRN

## 2013-03-23 MED ORDER — PSYLLIUM 95 % PO PACK
1.0000 | PACK | Freq: Every day | ORAL | Status: DC
  Administered 2013-03-24: 1 via ORAL
  Filled 2013-03-23 (×3): qty 1

## 2013-03-23 MED ORDER — PHENYLEPHRINE HCL 10 MG/ML IJ SOLN
INTRAMUSCULAR | Status: DC | PRN
  Administered 2013-03-23: 40 ug via INTRAVENOUS
  Administered 2013-03-23: 80 ug via INTRAVENOUS

## 2013-03-23 MED ORDER — OMEPRAZOLE MAGNESIUM 20 MG PO TBEC: 20.0000 mg | DELAYED_RELEASE_TABLET | ORAL | Status: DC | PRN

## 2013-03-23 MED ORDER — PROPOFOL 10 MG/ML IV EMUL
INTRAVENOUS | Status: DC | PRN
  Administered 2013-03-23: 20 mg via INTRAVENOUS

## 2013-03-23 MED ORDER — METOCLOPRAMIDE HCL 10 MG PO TABS: 5.0000 mg | ORAL_TABLET | Freq: Three times a day (TID) | ORAL | Status: DC | PRN

## 2013-03-23 MED ORDER — PROPOFOL INFUSION 10 MG/ML OPTIME
INTRAVENOUS | Status: DC | PRN
  Administered 2013-03-23: 100 ug/kg/min via INTRAVENOUS

## 2013-03-23 MED ORDER — EPHEDRINE SULFATE 50 MG/ML IJ SOLN
INTRAMUSCULAR | Status: AC
  Filled 2013-03-23: qty 1

## 2013-03-23 MED ORDER — PANTOPRAZOLE SODIUM 40 MG PO TBEC: 40.0000 mg | DELAYED_RELEASE_TABLET | Freq: Every day | ORAL | Status: DC | PRN

## 2013-03-23 MED ORDER — DEXAMETHASONE SODIUM PHOSPHATE 10 MG/ML IJ SOLN
10.0000 mg | Freq: Once | INTRAMUSCULAR | Status: AC
  Administered 2013-03-24: 10 mg via INTRAVENOUS
  Filled 2013-03-23: qty 1

## 2013-03-23 MED ORDER — CEFAZOLIN SODIUM-DEXTROSE 2-3 GM-% IV SOLR
2.0000 g | Freq: Four times a day (QID) | INTRAVENOUS | Status: AC
  Administered 2013-03-23 – 2013-03-24 (×2): 2 g via INTRAVENOUS
  Filled 2013-03-23 (×2): qty 50

## 2013-03-23 MED ORDER — PHENYLEPHRINE 40 MCG/ML (10ML) SYRINGE FOR IV PUSH (FOR BLOOD PRESSURE SUPPORT)
PREFILLED_SYRINGE | INTRAVENOUS | Status: AC
  Filled 2013-03-23: qty 10

## 2013-03-23 MED ORDER — KETOROLAC TROMETHAMINE 30 MG/ML IJ SOLN
INTRAMUSCULAR | Status: AC
  Filled 2013-03-23: qty 1

## 2013-03-23 MED ORDER — CHLORHEXIDINE GLUCONATE 4 % EX LIQD: 60.0000 mL | Freq: Once | CUTANEOUS | Status: DC

## 2013-03-23 MED ORDER — DEXAMETHASONE SODIUM PHOSPHATE 10 MG/ML IJ SOLN
INTRAMUSCULAR | Status: AC
  Filled 2013-03-23: qty 1

## 2013-03-23 MED ORDER — FERROUS SULFATE 325 (65 FE) MG PO TABS
325.0000 mg | ORAL_TABLET | Freq: Three times a day (TID) | ORAL | Status: DC
  Administered 2013-03-24 – 2013-03-25 (×4): 325 mg via ORAL
  Filled 2013-03-23 (×8): qty 1

## 2013-03-23 MED ORDER — ONDANSETRON HCL 4 MG/2ML IJ SOLN: 4.0000 mg | Freq: Four times a day (QID) | INTRAMUSCULAR | Status: DC | PRN

## 2013-03-23 MED ORDER — HYDROMORPHONE HCL PF 1 MG/ML IJ SOLN
0.2500 mg | INTRAMUSCULAR | Status: DC | PRN
Start: 2013-03-23 — End: 2013-03-23
  Administered 2013-03-23: 0.5 mg via INTRAVENOUS

## 2013-03-23 MED ORDER — ONDANSETRON HCL 4 MG PO TABS
4.0000 mg | ORAL_TABLET | Freq: Four times a day (QID) | ORAL | Status: DC | PRN
  Administered 2013-03-24: 4 mg via ORAL
  Filled 2013-03-23: qty 1

## 2013-03-23 MED ORDER — HYDROCODONE-ACETAMINOPHEN 5-325 MG PO TABS
1.0000 | ORAL_TABLET | ORAL | Status: DC
  Administered 2013-03-23 (×2): 1 via ORAL
  Administered 2013-03-24 (×4): 2 via ORAL
  Administered 2013-03-24: 1 via ORAL
  Administered 2013-03-24: 2 via ORAL
  Administered 2013-03-25: 1 via ORAL
  Administered 2013-03-25: 2 via ORAL
  Filled 2013-03-23 (×2): qty 2
  Filled 2013-03-23 (×2): qty 1
  Filled 2013-03-23: qty 2
  Filled 2013-03-23: qty 1
  Filled 2013-03-23 (×4): qty 2

## 2013-03-23 MED ORDER — DIPHENHYDRAMINE HCL 25 MG PO CAPS: 25.0000 mg | ORAL_CAPSULE | Freq: Four times a day (QID) | ORAL | Status: DC | PRN

## 2013-03-23 MED ORDER — SODIUM CHLORIDE 0.9 % IJ SOLN
INTRAMUSCULAR | Status: DC | PRN
  Administered 2013-03-23: 16:00:00

## 2013-03-23 MED ORDER — METHOCARBAMOL 500 MG PO TABS
500.0000 mg | ORAL_TABLET | Freq: Four times a day (QID) | ORAL | Status: DC | PRN
Start: 2013-03-23 — End: 2013-03-25
  Administered 2013-03-24 – 2013-03-25 (×5): 500 mg via ORAL
  Filled 2013-03-23 (×5): qty 1

## 2013-03-23 MED ORDER — HYDROMORPHONE HCL PF 1 MG/ML IJ SOLN
0.5000 mg | INTRAMUSCULAR | Status: DC | PRN
  Administered 2013-03-23: 0.5 mg via INTRAVENOUS
  Administered 2013-03-24: 1 mg via INTRAVENOUS
  Administered 2013-03-24: 0.5 mg via INTRAVENOUS
  Administered 2013-03-24 – 2013-03-25 (×3): 1 mg via INTRAVENOUS
  Filled 2013-03-23 (×6): qty 1

## 2013-03-23 MED ORDER — TRANEXAMIC ACID 100 MG/ML IV SOLN
1000.0000 mg | Freq: Once | INTRAVENOUS | Status: AC
  Administered 2013-03-23: 1000 mg via INTRAVENOUS
  Filled 2013-03-23: qty 10

## 2013-03-23 MED ORDER — BUPIVACAINE-EPINEPHRINE PF 0.25-1:200000 % IJ SOLN
INTRAMUSCULAR | Status: AC
  Filled 2013-03-23: qty 30

## 2013-03-23 MED ORDER — KETOROLAC TROMETHAMINE 30 MG/ML IJ SOLN
INTRAMUSCULAR | Status: DC | PRN
  Administered 2013-03-23: 30 mg via INTRAVENOUS

## 2013-03-23 SURGICAL SUPPLY — 55 items
BAG ZIPLOCK 12X15 (MISCELLANEOUS) ×3 IMPLANT
BANDAGE ELASTIC 6 VELCRO ST LF (GAUZE/BANDAGES/DRESSINGS) ×3 IMPLANT
BANDAGE ESMARK 6X9 LF (GAUZE/BANDAGES/DRESSINGS) ×1 IMPLANT
BLADE SAW SGTL 13.0X1.19X90.0M (BLADE) ×3 IMPLANT
BNDG ESMARK 6X9 LF (GAUZE/BANDAGES/DRESSINGS) ×3
BOWL SMART MIX CTS (DISPOSABLE) ×3 IMPLANT
CAPT RP KNEE ×3 IMPLANT
CEMENT HV SMART SET (Cement) ×6 IMPLANT
CUFF TOURN SGL QUICK 34 (TOURNIQUET CUFF) ×2
CUFF TRNQT CYL 34X4X40X1 (TOURNIQUET CUFF) ×1 IMPLANT
DECANTER SPIKE VIAL GLASS SM (MISCELLANEOUS) ×3 IMPLANT
DERMABOND ADVANCED (GAUZE/BANDAGES/DRESSINGS) ×2
DERMABOND ADVANCED .7 DNX12 (GAUZE/BANDAGES/DRESSINGS) ×1 IMPLANT
DRAPE EXTREMITY T 121X128X90 (DRAPE) ×3 IMPLANT
DRAPE POUCH INSTRU U-SHP 10X18 (DRAPES) ×3 IMPLANT
DRAPE U-SHAPE 47X51 STRL (DRAPES) ×3 IMPLANT
DRSG AQUACEL AG ADV 3.5X10 (GAUZE/BANDAGES/DRESSINGS) ×3 IMPLANT
DRSG TEGADERM 4X4.75 (GAUZE/BANDAGES/DRESSINGS) IMPLANT
DURAPREP 26ML APPLICATOR (WOUND CARE) ×6 IMPLANT
ELECT REM PT RETURN 9FT ADLT (ELECTROSURGICAL) ×3
ELECTRODE REM PT RTRN 9FT ADLT (ELECTROSURGICAL) ×1 IMPLANT
EVACUATOR 1/8 PVC DRAIN (DRAIN) IMPLANT
FACESHIELD LNG OPTICON STERILE (SAFETY) ×15 IMPLANT
GAUZE SPONGE 2X2 8PLY STRL LF (GAUZE/BANDAGES/DRESSINGS) IMPLANT
GLOVE BIOGEL PI IND STRL 7.5 (GLOVE) ×1 IMPLANT
GLOVE BIOGEL PI IND STRL 8 (GLOVE) ×1 IMPLANT
GLOVE BIOGEL PI INDICATOR 7.5 (GLOVE) ×2
GLOVE BIOGEL PI INDICATOR 8 (GLOVE) ×2
GLOVE ECLIPSE 8.0 STRL XLNG CF (GLOVE) ×3 IMPLANT
GLOVE ORTHO TXT STRL SZ7.5 (GLOVE) ×6 IMPLANT
GOWN SPEC L3 XXLG W/TWL (GOWN DISPOSABLE) ×3 IMPLANT
GOWN STRL REUS W/TWL LRG LVL3 (GOWN DISPOSABLE) ×3 IMPLANT
HANDPIECE INTERPULSE COAX TIP (DISPOSABLE) ×2
KIT BASIN OR (CUSTOM PROCEDURE TRAY) ×3 IMPLANT
MANIFOLD NEPTUNE II (INSTRUMENTS) ×3 IMPLANT
NDL SAFETY ECLIPSE 18X1.5 (NEEDLE) ×1 IMPLANT
NEEDLE HYPO 18GX1.5 SHARP (NEEDLE) ×2
NS IRRIG 1000ML POUR BTL (IV SOLUTION) ×3 IMPLANT
PACK TOTAL JOINT (CUSTOM PROCEDURE TRAY) ×3 IMPLANT
POSITIONER SURGICAL ARM (MISCELLANEOUS) ×3 IMPLANT
SET HNDPC FAN SPRY TIP SCT (DISPOSABLE) ×1 IMPLANT
SET PAD KNEE POSITIONER (MISCELLANEOUS) ×3 IMPLANT
SPONGE GAUZE 2X2 STER 10/PKG (GAUZE/BANDAGES/DRESSINGS)
SUCTION FRAZIER 12FR DISP (SUCTIONS) ×3 IMPLANT
SUT MNCRL AB 4-0 PS2 18 (SUTURE) ×3 IMPLANT
SUT VIC AB 1 CT1 36 (SUTURE) ×3 IMPLANT
SUT VIC AB 2-0 CT1 27 (SUTURE) ×6
SUT VIC AB 2-0 CT1 TAPERPNT 27 (SUTURE) ×3 IMPLANT
SUT VLOC 180 0 24IN GS25 (SUTURE) ×3 IMPLANT
SYR 50ML LL SCALE MARK (SYRINGE) ×3 IMPLANT
TOWEL OR 17X26 10 PK STRL BLUE (TOWEL DISPOSABLE) ×3 IMPLANT
TOWEL OR NON WOVEN STRL DISP B (DISPOSABLE) IMPLANT
TRAY FOLEY CATH 14FRSI W/METER (CATHETERS) ×3 IMPLANT
WATER STERILE IRR 1500ML POUR (IV SOLUTION) ×6 IMPLANT
WRAP KNEE MAXI GEL POST OP (GAUZE/BANDAGES/DRESSINGS) ×3 IMPLANT

## 2013-03-23 NOTE — Preoperative (Signed)
Beta Blockers   Reason not to administer Beta Blockers:Not Applicable 

## 2013-03-23 NOTE — Anesthesia Preprocedure Evaluation (Signed)
Anesthesia Evaluation  Patient identified by MRN, date of birth, ID band Patient awake    Reviewed: Allergy & Precautions, H&P , NPO status , Patient's Chart, lab work & pertinent test results  History of Anesthesia Complications (+) PONV  Airway Mallampati: II TM Distance: >3 FB Neck ROM: full    Dental no notable dental hx. (+) Teeth Intact and Dental Advisory Given   Pulmonary asthma ,  Mild infrequent asthma breath sounds clear to auscultation  Pulmonary exam normal       Cardiovascular Exercise Tolerance: Good negative cardio ROS  Rhythm:regular Rate:Normal     Neuro/Psych negative neurological ROS  negative psych ROS   GI/Hepatic negative GI ROS, Neg liver ROS, GERD-  Medicated and Controlled,  Endo/Other  negative endocrine ROS  Renal/GU negative Renal ROS  negative genitourinary   Musculoskeletal  (+) Arthritis -,   Abdominal   Peds  Hematology negative hematology ROS (+)   Anesthesia Other Findings   Reproductive/Obstetrics negative OB ROS                           Anesthesia Physical Anesthesia Plan  ASA: II  Anesthesia Plan: Spinal   Post-op Pain Management:    Induction:   Airway Management Planned: Simple Face Mask  Additional Equipment:   Intra-op Plan:   Post-operative Plan:   Informed Consent: I have reviewed the patients History and Physical, chart, labs and discussed the procedure including the risks, benefits and alternatives for the proposed anesthesia with the patient or authorized representative who has indicated his/her understanding and acceptance.   Dental Advisory Given  Plan Discussed with: CRNA and Surgeon  Anesthesia Plan Comments:         Anesthesia Quick Evaluation

## 2013-03-23 NOTE — Plan of Care (Signed)
Problem: Consults Goal: Diagnosis- Total Joint Replacement Right total knee     

## 2013-03-23 NOTE — Interval H&P Note (Signed)
History and Physical Interval Note:  03/23/2013 1:49 PM  Cathy Martinez  has presented today for surgery, with the diagnosis of RIGHT KNEE OA  The various methods of treatment have been discussed with the patient and family. After consideration of risks, benefits and other options for treatment, the patient has consented to  Procedure(s): RIGHT TOTAL KNEE ARTHROPLASTY (Right) as a surgical intervention .  The patient's history has been reviewed, patient examined, no change in status, stable for surgery.  I have reviewed the patient's chart and labs.  Questions were answered to the patient's satisfaction.     Mauri Pole

## 2013-03-23 NOTE — Transfer of Care (Signed)
Immediate Anesthesia Transfer of Care Note  Patient: Cathy Martinez  Procedure(s) Performed: Procedure(s) (LRB): RIGHT TOTAL KNEE ARTHROPLASTY (Right)  Patient Location: PACU  Anesthesia Type: Spinal  Level of Consciousness: sedated, patient cooperative and responds to stimulation  Airway & Oxygen Therapy: Patient Spontanous Breathing and Patient connected to face mask oxgen  Post-op Assessment: Report given to PACU RN and Post -op Vital signs reviewed and stable  Post vital signs: Reviewed and stable  Complications: No apparent anesthesia complications

## 2013-03-23 NOTE — Anesthesia Postprocedure Evaluation (Signed)
  Anesthesia Post-op Note  Patient: Cathy Martinez  Procedure(s) Performed: Procedure(s) (LRB): RIGHT TOTAL KNEE ARTHROPLASTY (Right)  Patient Location: PACU  Anesthesia Type: Spinal  Level of Consciousness: awake and alert   Airway and Oxygen Therapy: Patient Spontanous Breathing  Post-op Pain: mild  Post-op Assessment: Post-op Vital signs reviewed, Patient's Cardiovascular Status Stable, Respiratory Function Stable, Patent Airway and No signs of Nausea or vomiting  Last Vitals:  Filed Vitals:   03/23/13 1745  BP: 137/73  Pulse: 70  Temp:   Resp: 23    Post-op Vital Signs: stable   Complications: No apparent anesthesia complications

## 2013-03-23 NOTE — Op Note (Signed)
NAME:  Cathy Martinez RECORD NO.:  [id number]                             FACILITY:  [facility]      PHYSICIAN:  Pietro Cassis. Alvan Dame, M.D.  DATE OF BIRTH:  02-02-1932      DATE OF PROCEDURE:  03/23/2013                                     OPERATIVE REPORT         PREOPERATIVE DIAGNOSIS:  Right knee osteoarthritis.      POSTOPERATIVE DIAGNOSIS:  Right knee osteoarthritis.      FINDINGS:  The patient was noted to have complete loss of cartilage and   bone-on-bone arthritis with associated osteophytes in the medial and patellofemoral compartments of   the knee with a significant synovitis and associated effusion.      PROCEDURE:  Right total knee replacement.      COMPONENTS USED:  DePuy rotating platform posterior stabilized knee   system, a size 2 femur, 1.5 tibia, 12.5 mm PS insert, and 35 patellar   button.      SURGEON:  Pietro Cassis. Alvan Dame, M.D.      ASSISTANT:  Danae Orleans, PA-C.      ANESTHESIA:  Spinal.      SPECIMENS:  None.      COMPLICATION:  None.      DRAINS:  None  EBL: <50cc      TOURNIQUET TIME:   Total Tourniquet Time Documented: Thigh (Right) - 34 minutes Total: Thigh (Right) - 34 minutes  .      The patient was stable to the recovery room.      INDICATION FOR PROCEDURE:  Cathy Martinez is a 80 y.o. female patient of   mine.  The patient had been seen, evaluated, and treated conservatively in the   office with medication, activity modification, and injections.  The patient had   radiographic changes of bone-on-bone arthritis with endplate sclerosis and osteophytes noted.      The patient failed conservative measures including medication, injections, and activity modification, and at this point was ready for more definitive measures.   Based on the radiographic changes and failed conservative measures, the patient   decided to proceed with total knee replacement.  Risks of infection,   DVT, component failure, need for revision  surgery, postop course, and   expectations were all   discussed and reviewed.  Consent was obtained for benefit of pain   relief.      PROCEDURE IN DETAIL:  The patient was brought to the operative theater.   Once adequate anesthesia, preoperative antibiotics, 2 gm of Ancef administered, the patient was positioned supine with the right thigh tourniquet placed.  The  right lower extremity was prepped and draped in sterile fashion.  A time-   out was performed identifying the patient, planned procedure, and   extremity.      The right lower extremity was placed in the [facility] leg holder.  The leg was   exsanguinated, tourniquet elevated to 250 mmHg.  A midline incision was   made followed by median parapatellar arthrotomy.  Following initial   exposure, attention was first  directed to the patella.  Precut   measurement was noted to be 23 mm.  I resected down to 14 mm and used a   35 patellar button to restore patellar height as well as cover the cut   surface.      The lug holes were drilled and a metal shim was placed to protect the   patella from retractors and saw blades.      At this point, attention was now directed to the femur.  The femoral   canal was opened with a drill, irrigated to try to prevent fat emboli.  An   intramedullary rod was passed at 3 degrees valgus, 10 mm of bone was   resected off the distal femur.  Following this resection, the tibia was   subluxated anteriorly.  Using the extramedullary guide, 2 mm of bone was resected off   the proximal medial tibia.  We confirmed the gap would be   stable medially and laterally with a 10 mm insert as well as confirmed   the cut was perpendicular in the coronal plane, checking with an alignment rod.      Once this was done, I sized the femur to be a size 2 in the anterior-   posterior dimension, chose a standard component based on medial and   lateral dimension.  The size 2 rotation block was then pinned in   position  anterior referenced using the C-clamp to set rotation.  The   anterior, posterior, and  chamfer cuts were made without difficulty nor   notching making certain that I was along the anterior cortex to help   with flexion gap stability.      The final box cut was made off the lateral aspect of distal femur.      At this point, the tibia was sized to be a size 1.5, the size 1.5 tray was   then pinned in position through the medial third of the tubercle,   drilled, and keel punched.  Trial reduction was now carried with a 2 femur,  1.5 tibia, a 12.5 mm insert, and the 35 patella botton.  The knee was brought to   extension, full extension with good flexion stability with the patella   tracking through the trochlea without application of pressure.  Given   all these findings, the trial components removed.  Final components were   opened and cement was mixed.  The knee was irrigated with normal saline   solution and pulse lavage.  The synovial lining was   then injected with 20cc of Exparel, 30cc of 0.25% Marcaine with epinephrine and 1 cc of Toradol,   total of 61 cc.      The knee was irrigated.  Final implants were then cemented onto clean and   dried cut surfaces of bone with the knee brought to extension with a 12.5 mm trial insert.      Once the cement had fully cured, the excess cement was removed   throughout the knee.  I confirmed I was satisfied with the range of   motion and stability, and the final 12.5 mm PS insert was chosen.  It was   placed into the knee.      The tourniquet had been let down at 34 minutes.  No significant   hemostasis required.  The   extensor mechanism was then reapproximated using #1 Vicryl and #0 V-lock sutures with the knee   in flexion.  The  remaining wound was closed with 2-0 Vicryl and running 4-0 Monocryl.   The knee was cleaned, dried, dressed sterilely using Dermabond and   Aquacel dressing.  The patient was then   brought to recovery room in  stable condition, tolerating the procedure   well.   Please note that Physician Assistant, Danae Orleans, was present for the entirety of the case, and was utilized for pre-operative positioning, peri-operative retractor management, general facilitation of the procedure.  He was also utilized for primary wound closure at the end of the case.              Pietro Cassis Alvan Dame, M.D.    03/23/2013 4:23 PM

## 2013-03-23 NOTE — Anesthesia Procedure Notes (Signed)
Spinal  Patient location during procedure: OR Start time: 03/23/2013 2:56 PM End time: 03/23/2013 3:01 PM Staffing Anesthesiologist: Rod Mae L Performed by: anesthesiologist  Preanesthetic Checklist Completed: patient identified, site marked, surgical consent, pre-op evaluation, timeout performed, IV checked, risks and benefits discussed and monitors and equipment checked Spinal Block Patient position: sitting Prep: Betadine Patient monitoring: heart rate, continuous pulse ox and blood pressure Approach: right paramedian Location: L3-4 Injection technique: single-shot Needle Needle type: Spinocan  Needle gauge: 22 G Needle length: 9 cm Assessment Sensory level: T6 Additional Notes Expiration date of kit checked and confirmed. Patient tolerated procedure well, without complications.

## 2013-03-24 ENCOUNTER — Encounter (HOSPITAL_COMMUNITY): Payer: Self-pay | Admitting: Orthopedic Surgery

## 2013-03-24 LAB — BASIC METABOLIC PANEL
BUN: 10 mg/dL (ref 6–23)
CALCIUM: 8.3 mg/dL — AB (ref 8.4–10.5)
CO2: 24 meq/L (ref 19–32)
Chloride: 105 mEq/L (ref 96–112)
Creatinine, Ser: 0.58 mg/dL (ref 0.50–1.10)
GFR calc Af Amer: >90 mL/min (ref >90)
GFR calc non Af Amer: 85 mL/min — ABNORMAL LOW (ref >90)
GLUCOSE: 165 mg/dL — AB (ref 70–99)
Potassium: 5.2 mEq/L (ref 3.7–5.3)
SODIUM: 139 meq/L (ref 137–147)

## 2013-03-24 LAB — CBC
HEMATOCRIT: 38.5 % (ref 36.0–46.0)
HEMOGLOBIN: 12.8 g/dL (ref 12.0–15.0)
MCH: 30.5 pg (ref 26.0–34.0)
MCHC: 33.2 g/dL (ref 30.0–36.0)
MCV: 91.9 fL (ref 78.0–100.0)
Platelets: 253 10*3/uL (ref 150–400)
RBC: 4.19 MIL/uL (ref 3.87–5.11)
RDW: 15.1 % (ref 11.5–15.5)
WBC: 11.7 10*3/uL — ABNORMAL HIGH (ref 4.0–10.5)

## 2013-03-24 MED ORDER — PANTOPRAZOLE SODIUM 20 MG PO TBEC
20.0000 mg | DELAYED_RELEASE_TABLET | Freq: Every day | ORAL | Status: DC
  Administered 2013-03-24 – 2013-03-25 (×2): 20 mg via ORAL
  Filled 2013-03-24 (×2): qty 1

## 2013-03-24 NOTE — Evaluation (Signed)
Physical Therapy Evaluation Patient Details Name: Cathy Martinez MRN: [id number] DOB: [birth year] Today's Date: 03/24/2013 Time: 0920-0946 PT Time Calculation (min): 26 min  PT Assessment / Plan / Recommendation History of Present Illness  Pt with recent admission in Dec for SBO however now presents s/p R TKR  Clinical Impression  Pt is s/p R TKA resulting in the deficits listed below (see PT Problem List).  Pt will benefit from skilled PT to increase their independence and safety with mobility to allow discharge to the venue listed below.  Pt with limited mobility upon evaluation due to pain however able to tolerate getting up to recliner and performing a few exercises.  Pt plans to d/c to SNF.     PT Assessment  Patient needs continued PT services    Follow Up Recommendations  SNF    Does the patient have the potential to tolerate intense rehabilitation      Barriers to Discharge        Equipment Recommendations  Rolling walker with 5" wheels    Recommendations for Other Services     Frequency 7X/week    Precautions / Restrictions Precautions Precautions: Fall;Knee Restrictions Weight Bearing Restrictions: No Other Position/Activity Restrictions: WBAT   Pertinent Vitals/Pain 6-7/10 R knee pain during movement, RN notified, repositioned      Mobility  Bed Mobility Overal bed mobility: Needs Assistance Bed Mobility: Supine to Sit Supine to sit: Min assist General bed mobility comments: verbal cues for technique, assist for R LE Transfers Overall transfer level: Needs assistance Equipment used: Rolling walker (2 wheeled) Transfers: Sit to/from Stand Sit to Stand: Min assist General transfer comment: min verbal cues for hand placement and min assist to stablize throughout. Ambulation/Gait Ambulation/Gait assistance: Min assist Ambulation Distance (Feet): 3 Feet Assistive device: Rolling walker (2 wheeled) Gait Pattern/deviations: Step-to pattern;Decreased stance  time - right;Trunk flexed Gait velocity: decr General Gait Details: pt given step by step verbal cues for taking approx 8 steps from bed to recliner, unable to tolerate further ambulation    Exercises Total Joint Exercises Ankle Circles/Pumps: AROM;Both;15 reps Quad Sets: AROM;Both;15 reps Short Arc Quad: AROM;Right;10 reps Heel Slides: AROM;Right;10 reps Hip ABduction/ADduction: Right;10 reps;AAROM   PT Diagnosis: Difficulty walking;Acute pain  PT Problem List: Decreased strength;Decreased activity tolerance;Decreased mobility;Pain;Decreased knowledge of use of DME;Decreased range of motion PT Treatment Interventions: DME instruction;Gait training;Functional mobility training;Therapeutic activities;Therapeutic exercise;Patient/family education     PT Goals(Current goals can be found in the care plan section) Acute Rehab PT Goals Patient Stated Goal: be independent again PT Goal Formulation: With patient Time For Goal Achievement: 03/31/13 Potential to Achieve Goals: Good  Visit Information  Last PT Received On: 03/24/13 Assistance Needed: +1 History of Present Illness: Pt with recent admission in Dec for SBO however now presents s/p R TKR       Prior Functioning  Home Living Family/patient expects to be discharged to:: Skilled nursing facility Living Arrangements: Alone Prior Function Level of Independence: Independent with assistive device(s) Comments: recently began ambulating with a SPC secondary to Rt knee pain  Communication Communication: No difficulties    Cognition  Cognition Arousal/Alertness: Awake/alert Behavior During Therapy: WFL for tasks assessed/performed Overall Cognitive Status: Within Functional Limits for tasks assessed    Extremity/Trunk Assessment Upper Extremity Assessment Upper Extremity Assessment: Overall WFL for tasks assessed Lower Extremity Assessment Lower Extremity Assessment: RLE deficits/detail RLE Deficits / Details: able to perform  SLR, AROM R knee -2-80*   Balance General Comments General comments (skin integrity, edema,  etc.): min assist balance standing to perform hygiene.  End of Session PT - End of Session Equipment Utilized During Treatment: Gait belt Activity Tolerance: Patient limited by pain;Patient limited by fatigue Patient left: in chair;with call bell/phone within reach;with family/visitor present Nurse Communication: Patient requests pain meds  GP     Latise Dilley,KATHrine E 03/24/2013, 1:16 PM Carmelia Bake, PT, DPT 03/24/2013 Pager: [phone]

## 2013-03-24 NOTE — Progress Notes (Signed)
Utilization review completed.  

## 2013-03-24 NOTE — Evaluation (Signed)
Occupational Therapy Evaluation Patient Details Name: Cathy Martinez MRN: [id number] DOB: 1931/03/17 Today's Date: 03/24/2013 Time: 3220-2542 OT Time Calculation (min): 25 min  OT Assessment / Plan / Recommendation History of present illness Pt with recent admission in Dec for SBO however now presents s/p R TKR   Clinical Impression   Pt limited by pain and nausea during session but is very motivated. Will benefit from skilled OT services to maximize ADL independence for next venue of care.     OT Assessment  Patient needs continued OT Services    Follow Up Recommendations  SNF;Supervision/Assistance - 24 hour    Barriers to Discharge      Equipment Recommendations  None recommended by OT    Recommendations for Other Services    Frequency  Min 2X/week    Precautions / Restrictions Precautions Precautions: Fall;Knee Restrictions Weight Bearing Restrictions: No Other Position/Activity Restrictions: WBAT   Pertinent Vitals/Pain 5/10 with initial movement 8/10 later in session Nursing informed and ice placed    ADL  Eating/Feeding: Simulated;Independent Where Assessed - Eating/Feeding: Chair Grooming: Performed;Wash/dry hands;Set up Where Assessed - Grooming: Supported sitting Upper Body Bathing: Performed;Chest;Right arm;Left arm;Abdomen;Set up;Supervision/safety Where Assessed - Upper Body Bathing: Unsupported sitting Lower Body Bathing: Simulated;Moderate assistance Where Assessed - Lower Body Bathing: Supported sit to stand Upper Body Dressing: Simulated;Minimal assistance Where Assessed - Upper Body Dressing: Unsupported sitting Lower Body Dressing: Simulated;Moderate assistance Where Assessed - Lower Body Dressing: Supported sit to Lobbyist: Performed;Minimal assistance (only to bathroom doorway then 3in1 BSC pulled up) Toilet Transfer Equipment: Bedside commode Toileting - Clothing Manipulation and Hygiene: Performed;Minimal assistance Where  Assessed - Best boy and Hygiene: Sit to stand from 3-in-1 or toilet Equipment Used: Rolling walker;Gait belt ADL Comments: Pt limited by pain. at 5/10 with initial transfers and made it to doorway of bathroom and then needed to sit down. BSC pulled up. Daughter assisted with switching out BSC to recliner behind pt and wheeled her back over to bed. Pt vomitted once back in chair also. Informed secretary to inform pt's nurse as her nurse was in another room. Pt planning SNF. At 8/10 by end of session. Nursing had come in during early part of session and stated she would be bringing meds.     OT Diagnosis: Generalized weakness;Acute pain  OT Problem List: Decreased strength;Decreased knowledge of use of DME or AE;Decreased activity tolerance OT Treatment Interventions: Self-care/ADL training;DME and/or AE instruction;Therapeutic activities;Patient/family education   OT Goals(Current goals can be found in the care plan section) Acute Rehab OT Goals Patient Stated Goal: be independent again OT Goal Formulation: With patient/family Time For Goal Achievement: 03/31/13 Potential to Achieve Goals: Good  Visit Information  Last OT Received On: 03/24/13 Assistance Needed: +1 History of Present Illness: Pt with recent admission in Dec for SBO however now presents s/p R TKR       Prior Functioning     Home Living Family/patient expects to be discharged to:: Skilled nursing facility Living Arrangements: Alone Prior Function Level of Independence: Independent with assistive device(s) Comments: recently began ambulating with a SPC secondary to Rt knee pain  Communication Communication: No difficulties         Vision/Perception     Cognition  Cognition Arousal/Alertness: Awake/alert Behavior During Therapy: WFL for tasks assessed/performed Overall Cognitive Status: Within Functional Limits for tasks assessed    Extremity/Trunk Assessment Upper Extremity  Assessment Upper Extremity Assessment: Overall WFL for tasks assessed Lower Extremity Assessment Lower Extremity Assessment: RLE  deficits/detail RLE Deficits / Details: able to perform SLR, AROM R knee -2-80*     Mobility  Transfers Overall transfer level: Needs assistance Equipment used: Rolling walker (2 wheeled) Transfers: Sit to/from Stand Sit to Stand: Min assist General transfer comment: min verbal cues for hand placement and min assist to stablize throughout.        Balance General Comments General comments (skin integrity, edema, etc.): min assist balance standing to perform hygiene.   End of Session OT - End of Session Equipment Utilized During Treatment: Gait belt;Rolling walker Activity Tolerance: Patient limited by pain;Other (comment) (nausea) Patient left: in chair;with call bell/phone within reach  GO     Jules Schick [phone] 03/24/2013, 1:11 PM

## 2013-03-24 NOTE — Progress Notes (Signed)
Clinical Social Work Department BRIEF PSYCHOSOCIAL ASSESSMENT 03/24/2013  Patient:  Cathy Martinez, Cathy Martinez     Account Number:  [id number]     Admit date:  03/23/2013  Clinical Social Worker:  Lacie Scotts  Date/Time:  03/24/2013 12:23 PM  Referred by:  Physician  Date Referred:  03/24/2013 Referred for  SNF Placement   Other Referral:   Interview type:  Patient Other interview type:    PSYCHOSOCIAL DATA Living Status:  ALONE Admitted from facility:   Level of care:   Primary support name:  Mel King Primary support relationship to patient:  CHILD, ADULT Degree of support available:   unclear    CURRENT CONCERNS Current Concerns  Post-Acute Placement   Other Concerns:    SOCIAL WORK ASSESSMENT / PLAN Pt is a 80 yr old female living at home prior to hospitalization. CSW met with pt to assist with d/c planning. Pt has made prior arrangements to have ST Rehab at [facility] ) following hospital d/c. CSW has contacted SNF and d/c plans have been confirmed. CSW will continue to follow to assist with d/c planning to SNF.   Assessment/plan status:  Psychosocial Support/Ongoing Assessment of Needs Other assessment/ plan:   Information/referral to community resources:   SNF has reviewed insurance coverage for placement with pt.    PATIENT'S/FAMILY'S RESPONSE TO PLAN OF CARE: " I live in [place]. [place] to [place] home. " Pt is looking forward to having rehab at [place].   Werner Lean LCSW [phone]

## 2013-03-24 NOTE — Progress Notes (Signed)
Physical Therapy Treatment Note   03/24/13 1400  PT Visit Information  Last PT Received On 03/24/13  Assistance Needed +1  PT Time Calculation  PT Start Time 1425  PT Stop Time 1439  PT Time Calculation (min) 14 min  Subjective Data  Subjective Pt able to ambulate into hallway this afternoon however still reports increased R knee pain with mobility so assisted back to bed.  Ice packs applied end of session  Precautions  Precautions Fall;Knee  Restrictions  Other Position/Activity Restrictions WBAT  Cognition  Arousal/Alertness Awake/alert  Behavior During Therapy WFL for tasks assessed/performed  Overall Cognitive Status Within Functional Limits for tasks assessed  Bed Mobility  Overal bed mobility Needs Assistance  Bed Mobility Supine to Sit;Sit to Supine  Supine to sit Supervision  Sit to supine Supervision  General bed mobility comments increased time and effort however no physical assist required  Transfers  Overall transfer level Needs assistance  Equipment used Rolling walker (2 wheeled)  Transfers Sit to/from Stand  Sit to Stand Min guard  General transfer comment verbal cues for technique  Ambulation/Gait  Ambulation/Gait assistance Min guard  Ambulation Distance (Feet) 15 Feet  Assistive device Rolling walker (2 wheeled)  Gait Pattern/deviations Step-to pattern;Antalgic  Gait velocity decr  General Gait Details verbal cues for sequence, RW distance, step length  PT - End of Session  Activity Tolerance Patient limited by pain;Patient limited by fatigue  Patient left in bed;with call bell/phone within reach;with family/visitor present  PT - Assessment/Plan  PT Plan Current plan remains appropriate  PT Frequency 7X/week  Follow Up Recommendations SNF  PT equipment Rolling walker with 5" wheels  PT Goal Progression  Progress towards PT goals Progressing toward goals  PT General Charges  $$ ACUTE PT VISIT 1 Procedure  PT Treatments  $Gait Training 8-22 mins    Carmelia Bake, PT, DPT 03/24/2013 Pager: [phone]

## 2013-03-24 NOTE — Progress Notes (Signed)
Patient ID: Cathy Martinez, female   DOB: 05/06/1931, 80 y.o.   MRN: [id number] Subjective: 1 Day Post-Op Procedure(s) (LRB): RIGHT TOTAL KNEE ARTHROPLASTY (Right)    Patient reports pain as mild. Comfortable this am.  Daughter reports that she was a bit figidity last night  Objective:   VITALS:   Filed Vitals:   03/24/13 0648  BP:   Pulse: 58  Temp:   Resp:     Neurovascular intact Incision: dressing C/D/I  LABS  Recent Labs  03/24/13 0424  HGB 12.8  HCT 38.5  WBC 11.7*  PLT 253     Recent Labs  03/24/13 0424  NA 139  K 5.2  BUN 10  CREATININE 0.58  GLUCOSE 165*    No results found for this basename: LABPT, INR,  in the last 72 hours   Assessment/Plan: 1 Day Post-Op Procedure(s) (LRB): RIGHT TOTAL KNEE ARTHROPLASTY (Right)   Up with therapy Discharge home with home health if progresses well wil be discharged to home later today after 2 therapy sessions

## 2013-03-24 NOTE — Progress Notes (Signed)
Clinical Social Work Department CLINICAL SOCIAL WORK PLACEMENT NOTE 03/24/2013  Patient:  Cathy Martinez, Cathy Martinez  Account Number:  [id number] Admit date:  03/23/2013  Clinical Social Worker:  Werner Lean, LCSW  Date/time:  03/24/2013 12:44 PM  Clinical Social Work is seeking post-discharge placement for this patient at the following level of care:   SKILLED NURSING   (*CSW will update this form in Epic as items are completed)     Patient/family provided with [place] Department of Clinical Social Work's list of facilities offering this level of care within the geographic area requested by the patient (or if unable, by the patient's family).  03/24/2013  Patient/family informed of their freedom to choose among providers that offer the needed level of care, that participate in Medicare, Medicaid or managed care program needed by the patient, have an available bed and are willing to accept the patient.    Patient/family informed of [facility]' ownership interest in [facility], as well as of the fact that they are under no obligation to receive care at this facility.  PASARR submitted to [employer] on  PASARR number received from [place] on 02/03/2013  FL2 transmitted to all facilities in geographic area requested by pt/family on  03/24/2013 FL2 transmitted to all facilities within larger geographic area on 03/24/2013  Patient informed that his/her managed care company has contracts with or will negotiate with  certain facilities, including the following:     Patient/family informed of bed offers received:  03/24/2013 Patient chooses bed at [facility] [place] Physician recommends and patient chooses bed at    Patient to be transferred to [place] on   Patient to be transferred to facility by   The following physician request were entered in Epic:   Additional Comments:  Werner Lean LCSW [phone]

## 2013-03-25 DIAGNOSIS — D62 Acute posthemorrhagic anemia: Secondary | ICD-10-CM | POA: Diagnosis not present

## 2013-03-25 LAB — BASIC METABOLIC PANEL
BUN: 11 mg/dL (ref 6–23)
CALCIUM: 8.4 mg/dL (ref 8.4–10.5)
CO2: 24 mEq/L (ref 19–32)
CREATININE: 0.67 mg/dL (ref 0.50–1.10)
Chloride: 104 mEq/L (ref 96–112)
GFR calc non Af Amer: 81 mL/min — ABNORMAL LOW (ref >90)
Glucose, Bld: 165 mg/dL — ABNORMAL HIGH (ref 70–99)
Potassium: 5 mEq/L (ref 3.7–5.3)
SODIUM: 137 meq/L (ref 137–147)

## 2013-03-25 LAB — CBC
HCT: 35.7 % — ABNORMAL LOW (ref 36.0–46.0)
Hemoglobin: 11.7 g/dL — ABNORMAL LOW (ref 12.0–15.0)
MCH: 30.2 pg (ref 26.0–34.0)
MCHC: 32.8 g/dL (ref 30.0–36.0)
MCV: 92.2 fL (ref 78.0–100.0)
PLATELETS: 220 10*3/uL (ref 150–400)
RBC: 3.87 MIL/uL (ref 3.87–5.11)
RDW: 15.4 % (ref 11.5–15.5)
WBC: 10.3 10*3/uL (ref 4.0–10.5)

## 2013-03-25 MED ORDER — FERROUS SULFATE 325 (65 FE) MG PO TABS: 325.0000 mg | ORAL_TABLET | Freq: Three times a day (TID) | ORAL | Status: DC

## 2013-03-25 MED ORDER — POLYETHYLENE GLYCOL 3350 17 G PO PACK: 17.0000 g | PACK | Freq: Two times a day (BID) | ORAL | Status: DC

## 2013-03-25 MED ORDER — TIZANIDINE HCL 4 MG PO TABS: 4.0000 mg | ORAL_TABLET | Freq: Four times a day (QID) | ORAL | Status: AC | PRN

## 2013-03-25 MED ORDER — HYDROCODONE-ACETAMINOPHEN 5-325 MG PO TABS: 1.0000 | ORAL_TABLET | ORAL | Status: DC | PRN

## 2013-03-25 MED ORDER — ASPIRIN 325 MG PO TBEC: 325.0000 mg | DELAYED_RELEASE_TABLET | Freq: Two times a day (BID) | ORAL | Status: AC

## 2013-03-25 MED ORDER — DSS 100 MG PO CAPS: 100.0000 mg | ORAL_CAPSULE | Freq: Two times a day (BID) | ORAL | Status: DC

## 2013-03-25 NOTE — Progress Notes (Signed)
Clinical Social Work Department CLINICAL SOCIAL WORK PLACEMENT NOTE 03/25/2013  Patient:  Cathy Martinez, Cathy Martinez  Account Number:  [id number] Admit date:  03/23/2013  Clinical Social Worker:  Werner Lean, LCSW  Date/time:  03/24/2013 12:44 PM  Clinical Social Work is seeking post-discharge placement for this patient at the following level of care:   SKILLED NURSING   (*CSW will update this form in Epic as items are completed)     Patient/family provided with [place] Department of Clinical Social Work's list of facilities offering this level of care within the geographic area requested by the patient (or if unable, by the patient's family).  03/24/2013  Patient/family informed of their freedom to choose among providers that offer the needed level of care, that participate in Medicare, Medicaid or managed care program needed by the patient, have an available bed and are willing to accept the patient.    Patient/family informed of [facility]' ownership interest in [facility], as well as of the fact that they are under no obligation to receive care at this facility.  PASARR submitted to EDS on  PASARR number received from [employer] on 02/03/2013  FL2 transmitted to all facilities in geographic area requested by pt/family on  03/24/2013 FL2 transmitted to all facilities within larger geographic area on 03/24/2013  Patient informed that his/her managed care company has contracts with or will negotiate with  certain facilities, including the following:     Patient/family informed of bed offers received:  03/24/2013 Patient chooses bed at [facility] [place] Physician recommends and patient chooses bed at    Patient to be transferred to [place] on  03/25/2013 Patient to be transferred to facility by [facility]  The following physician request were entered in Epic:   Additional Comments: Insurance provided authorization to SNF prior  to d/c. Pt is aware that insurance may not cover cost of ambulance transport.  Werner Lean LCSW [phone]

## 2013-03-25 NOTE — Progress Notes (Signed)
Physical Therapy Treatment Patient Details Name: Cathy Martinez MRN: [id number] DOB: [birth year] Today's Date: 03/25/2013 Time: 8299-3716 PT Time Calculation (min): 28 min  PT Assessment / Plan / Recommendation  History of Present Illness Pt with recent admission in Dec for SBO however now presents s/p R TKR   PT Comments   Pt ambulated in hallway and able to tolerate increase in distance.  Pt also performed exercises in recliner.  Pt to d/c to SNF today.   Follow Up Recommendations  SNF     Does the patient have the potential to tolerate intense rehabilitation     Barriers to Discharge        Equipment Recommendations  Rolling walker with 5" wheels    Recommendations for Other Services    Frequency 7X/week   Progress towards PT Goals Progress towards PT goals: Progressing toward goals  Plan Current plan remains appropriate    Precautions / Restrictions Precautions Precautions: Fall;Knee Restrictions Other Position/Activity Restrictions: WBAT   Pertinent Vitals/Pain Mod R knee pain with activity, premedicated, ice packs applied end of session    Mobility  Bed Mobility Overal bed mobility: Needs Assistance Bed Mobility: Supine to Sit Supine to sit: Supervision General bed mobility comments: increased time and effort however no physical assist required Transfers Overall transfer level: Needs assistance Equipment used: Rolling walker (2 wheeled) Transfers: Sit to/from Stand Sit to Stand: Min guard General transfer comment: verbal cues for technique Ambulation/Gait Ambulation/Gait assistance: Min guard Ambulation Distance (Feet): 80 Feet Assistive device: Rolling walker (2 wheeled) Gait Pattern/deviations: Step-to pattern;Antalgic Gait velocity: decr General Gait Details: verbal cues for sequence, RW distance, step length    Exercises Total Joint Exercises Ankle Circles/Pumps: AROM;Both;15 reps Quad Sets: AROM;Both;15 reps Heel Slides: Right;10 reps;AAROM Hip  ABduction/ADduction: Right;10 reps;AROM   PT Diagnosis:    PT Problem List:   PT Treatment Interventions:     PT Goals (current goals can now be found in the care plan section)    Visit Information  Last PT Received On: 03/25/13 Assistance Needed: +1 History of Present Illness: Pt with recent admission in Dec for SBO however now presents s/p R TKR    Subjective Data      Cognition  Cognition Arousal/Alertness: Awake/alert Behavior During Therapy: WFL for tasks assessed/performed Overall Cognitive Status: Within Functional Limits for tasks assessed    Balance     End of Session PT - End of Session Activity Tolerance: Patient tolerated treatment well Patient left: in chair;with call bell/phone within reach;with family/visitor present   GP     Demetrius Mahler,KATHrine E 03/25/2013, 10:21 AM Carmelia Bake, PT, DPT 03/25/2013 Pager: [phone]

## 2013-03-25 NOTE — Progress Notes (Signed)
Discharge summary sent to payer through [employer]  

## 2013-03-25 NOTE — Discharge Summary (Signed)
Physician Discharge Summary  Patient ID: Cathy Martinez MRN: [id number] DOB/AGE: Jul 16, 1931 80 y.o.  Admit date: 03/23/2013 Discharge date:  03/25/2013  Procedures:  Procedure(s) (LRB): RIGHT TOTAL KNEE ARTHROPLASTY (Right)  Attending Physician:  Dr. Paralee Cancel   Admission Diagnoses:   Right knee OA / pain  Discharge Diagnoses:  Principal Problem:   S/P right TKA Active Problems:   Expected blood loss anemia  Past Medical History  Diagnosis Date  . Asthma     very rare  . H/O small bowel obstruction 2014  . Diverticulosis   . H/O bronchitis   . Pneumonia     hx of 3 years ago  . Sciatica   . GERD (gastroesophageal reflux disease)     occasional  . Rheumatoid arthritis   . PONV (postoperative nausea and vomiting)     HPI:    Cathy Martinez, 80 y.o. female, has a history of pain and functional disability in the right knee due to arthritis and has failed non-surgical conservative treatments for greater than 12 weeks to includeNSAID's and/or analgesics, corticosteriod injections, use of assistive devices and activity modification. Onset of symptoms was gradual, starting 2+ years ago with gradually worsening course since that time. The patient noted no past surgery on the right knee(s). Patient currently rates pain in the right knee(s) at 8 out of 10 with activity. Patient has worsening of pain with activity and weight bearing, pain that interferes with activities of daily living, pain with passive range of motion, crepitus and joint swelling. Patient has evidence of periarticular osteophytes and joint space narrowing by imaging studies. There is no active infection. Risks, benefits and expectations were discussed with the patient. Risks including but not limited to the risk of anesthesia, blood clots, nerve damage, blood vessel damage, failure of the prosthesis, infection and up to and including death. Patient understand the risks, benefits and expectations and wishes to proceed  with surgery.   PCP: Tamsen Roers, MD   Discharged Condition: good  Hospital Course:  Patient underwent the above stated procedure on 03/23/2013. Patient tolerated the procedure well and brought to the recovery room in good condition and subsequently to the floor.  POD #1 BP: 134/64 ; Pulse: 58 ; Temp: 97.6 F (36.4 C) ; Resp: 16 Patient reports pain as mild. Comfortable this am. Daughter reports that she was a bit figidity last night Neurovascular intact, dorsiflexion/plantar flexion intact, incision: dressing C/D/I, no cellulitis present and compartment soft.   LABS  Basename    HGB  12.8  HCT  38.5   POD #2  BP: 129/56 ; Pulse: 52 ; Temp: 97.7 F (36.5 C) ; Resp: 16 Patient reports pain as mild, pain controlled. No events throughout the night. Neurovascular intact, dorsiflexion/plantar flexion intact, incision: dressing C/D/I, no cellulitis present and compartment soft.   LABS  Basename    HGB  11.7  HCT  35.7    Discharge Exam: General appearance: alert, cooperative and no distress Extremities: Homans sign is negative, no sign of DVT, no edema, redness or tenderness in the calves or thighs and no ulcers, gangrene or trophic changes  Disposition:    Skilled nursing facility with follow up in 2 weeks   Follow-up Information   Follow up with Mauri Pole, MD. Schedule an appointment as soon as possible for a visit in 2 weeks.   Specialty:  Orthopedic Surgery   Contact information:   [address] [place] [address] [phone]  Discharge Orders   Future Orders Complete By Expires   Call MD / Call 911  As directed    Comments:     If you experience chest pain or shortness of breath, CALL 911 and be transported to the hospital emergency room.  If you develope a fever above 101 F, pus (white drainage) or increased drainage or redness at the wound, or calf pain, call your surgeon's office.   Change dressing  As directed    Comments:      Maintain surgical dressing for 10-14 days, or until follow up in the clinic.   Constipation Prevention  As directed    Comments:     Drink plenty of fluids.  Prune juice may be helpful.  You may use a stool softener, such as Colace (over the counter) 100 mg twice a day.  Use MiraLax (over the counter) for constipation as needed.   Diet - low sodium heart healthy  As directed    Discharge instructions  As directed    Comments:     Maintain surgical dressing for 10-14 days, or until follow up in the clinic. Follow up in 2 weeks at [facility]. Call with any questions or concerns.   Driving restrictions  As directed    Comments:     No driving for 4 weeks   Increase activity slowly as tolerated  As directed    TED hose  As directed    Comments:     Use stockings (TED hose) for 2 weeks on both leg(s).  You may remove them at night for sleeping.   Weight bearing as tolerated  As directed    Questions:     Laterality:     Extremity:          Medication List         acyclovir 200 MG capsule  Commonly known as:  ZOVIRAX  Take 200 mg by mouth 2 (two) times daily.     albuterol 108 (90 BASE) MCG/ACT inhaler  Commonly known as:  PROVENTIL HFA;VENTOLIN HFA  Inhale 1 puff into the lungs every 6 (six) hours as needed for wheezing or shortness of breath.     aspirin 325 MG EC tablet  Take 1 tablet (325 mg total) by mouth 2 (two) times daily.     carboxymethylcellulose 0.5 % Soln  Commonly known as:  REFRESH PLUS  Place 1 drop into both eyes 3 (three) times daily as needed (eye irritation).     DSS 100 MG Caps  Take 100 mg by mouth 2 (two) times daily.     ferrous sulfate 325 (65 FE) MG tablet  Take 1 tablet (325 mg total) by mouth 3 (three) times daily after meals.     HYDROcodone-acetaminophen 5-325 MG per tablet  Commonly known as:  NORCO/VICODIN  Take 1-2 tablets by mouth every 4 (four) hours as needed for moderate pain.     Krill Oil 300 MG Caps  Take 1 capsule by  mouth daily.     omeprazole 20 MG tablet  Commonly known as:  PRILOSEC OTC  Take 20 mg by mouth as needed.     polyethylene glycol packet  Commonly known as:  MIRALAX / GLYCOLAX  Take 17 g by mouth 2 (two) times daily.     psyllium 58.6 % powder  Commonly known as:  METAMUCIL  Take 1 packet by mouth daily.     tiZANidine 4 MG tablet  Commonly known as:  ZANAFLEX  Take 1 tablet (4 mg total) by mouth every 6 (six) hours as needed for muscle spasms.     vitamin C 500 MG tablet  Commonly known as:  ASCORBIC ACID  Take 500 mg by mouth daily.         Signed: [place] Pugh. Maximiano Lott   PAC  03/25/2013, 8:30 AM

## 2013-03-25 NOTE — Progress Notes (Signed)
   Subjective: 2 Days Post-Op Procedure(s) (LRB): RIGHT TOTAL KNEE ARTHROPLASTY (Right)   Patient reports pain as mild, pain controlled. No events throughout the night.  Objective:   VITALS:   Filed Vitals:   03/25/13 0443  BP: 129/56  Pulse: 52  Temp: 97.7 F (36.5 C)  Resp: 16    Neurovascular intact Dorsiflexion/Plantar flexion intact Incision: dressing C/D/I No cellulitis present Compartment soft  LABS  Recent Labs  03/24/13 0424 03/25/13 0425  HGB 12.8 11.7*  HCT 38.5 35.7*  WBC 11.7* 10.3  PLT 253 220     Recent Labs  03/24/13 0424 03/25/13 0425  NA 139 137  K 5.2 5.0  BUN 10 11  CREATININE 0.58 0.67  GLUCOSE 165* 165*     Assessment/Plan: 2 Days Post-Op Procedure(s) (LRB): RIGHT TOTAL KNEE ARTHROPLASTY (Right)   Up with therapy Discharge to SNF eventually, when ready Follow up in 2 weeks at [facility]. Follow up with OLIN,Casondra Gasca D in 2 weeks.  Contact information:  [facility] [address] [place] [phone]    Expected ABLA  Treated with iron and will observe      West Pugh. Christoper Bushey   PAC  03/25/2013, 8:21 AM

## 2013-03-26 NOTE — Care Management Note (Signed)
    Page 1 of 1   03/26/2013     10:36:11 AM   CARE MANAGEMENT NOTE 03/26/2013  Patient:  Cathy Martinez, Cathy Martinez   Account Number:  [id number]  Date Initiated:  03/25/2013  Documentation initiated by:  Sherrin Daisy  Subjective/Objective Assessment:   dx rt total knee replacemnt     Action/Plan:   Plans states she plans to go to skilled facility for rehab   Anticipated DC Date:  03/25/2012   Anticipated DC Plan:  [place]  In-house referral  Clinical Social Worker      DC Planning Services  CM consult      Choice offered to / List presented to:             Status of service:  Completed, signed off Medicare Important Message given?  NA - LOS <3 / Initial given by admissions (If response is "NO", the following Medicare IM given date fields will be blank) Date Medicare IM given:   Date Additional Medicare IM given:    Discharge Disposition:  [place]  Per UR Regulation:    If discussed at Long Length of Stay Meetings, dates discussed:    Comments:

## 2015-02-05 IMAGING — CR DG CHEST 1V PORT
1 series · 1 of 1 positions shown · non-contrast
Comparison: None.

CLINICAL DATA: Vomiting

EXAM:
PORTABLE CHEST - 1 VIEW

[AP]
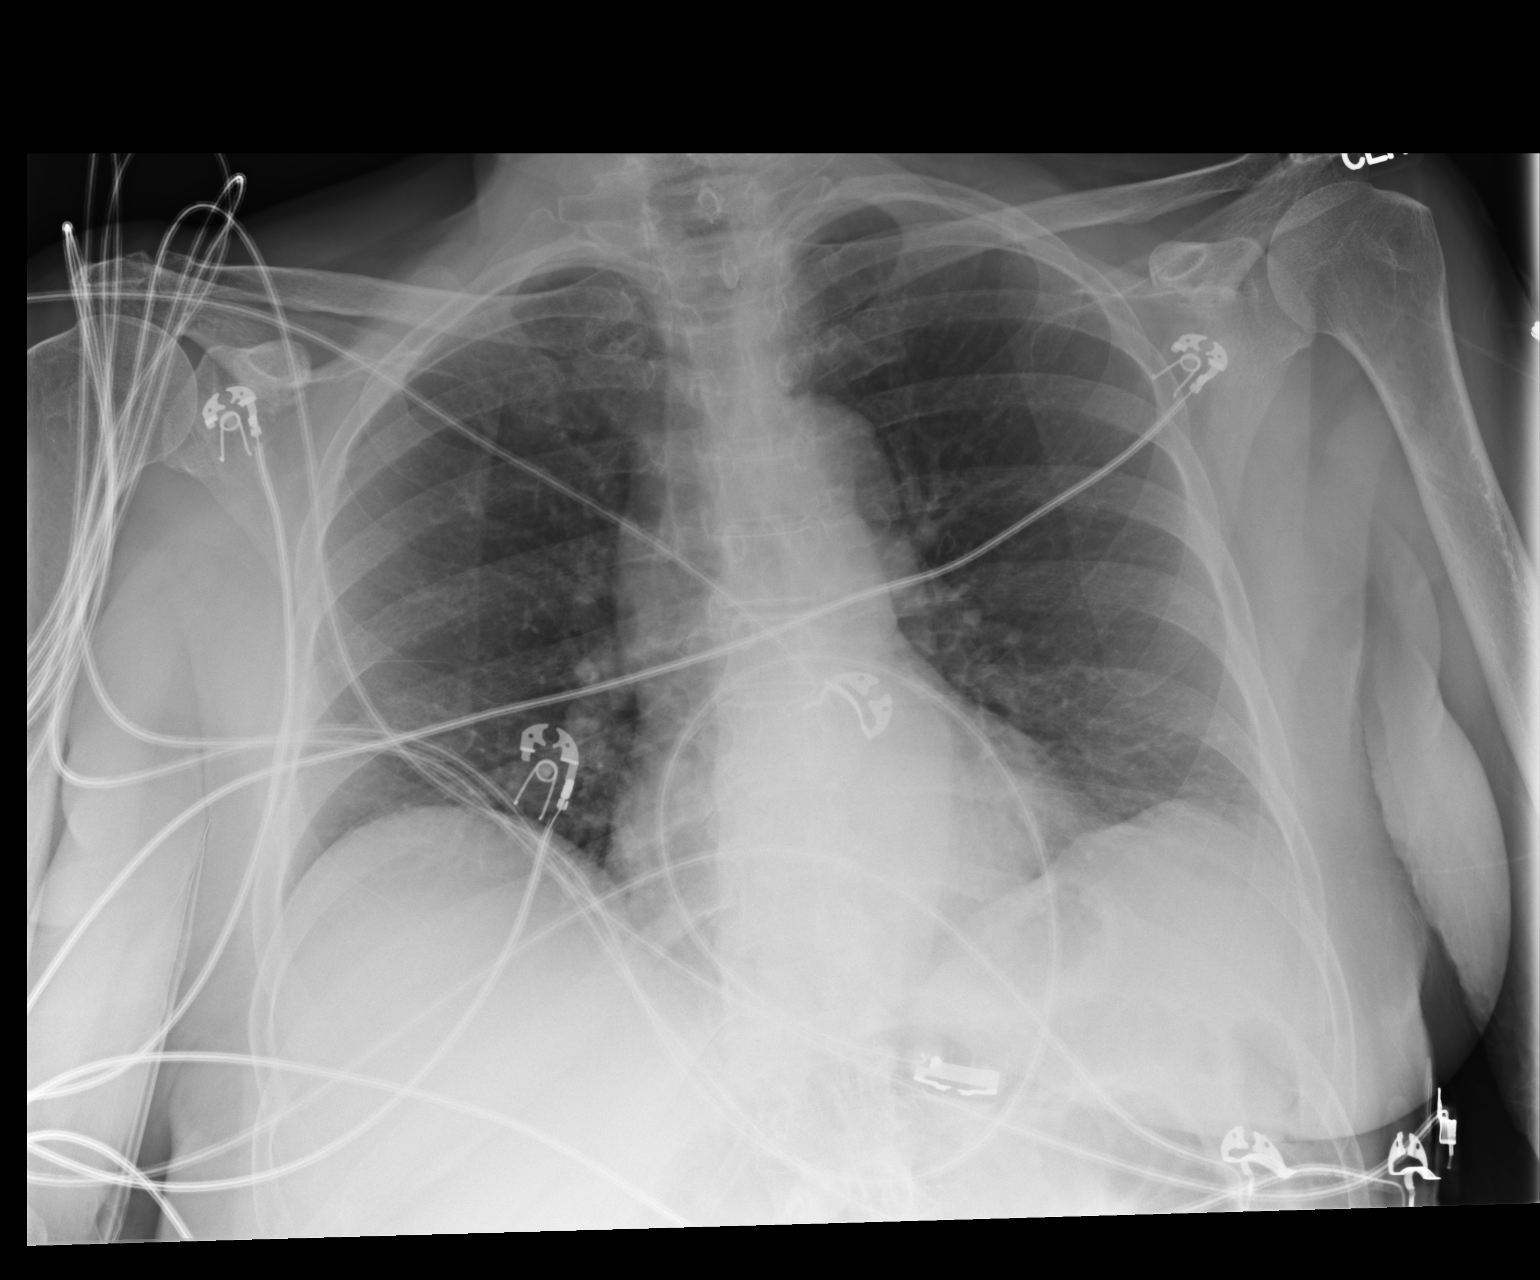

[1 of 1 positions shown; findings below may reference images not displayed]

FINDINGS: The heart size and mediastinal contours are within normal limits.
Both lungs are clear. The visualized skeletal structures are
unremarkable.
IMPRESSION: No active disease.

## 2015-02-06 IMAGING — CR DG ABD PORTABLE 2V
2 series · 2 of 2 positions shown · non-contrast
Comparison: CT of the abdomen and pelvis on 02/01/2013.

CLINICAL DATA: Small bowel obstruction.

EXAM:
PORTABLE ABDOMEN - 2 VIEW

[AP]
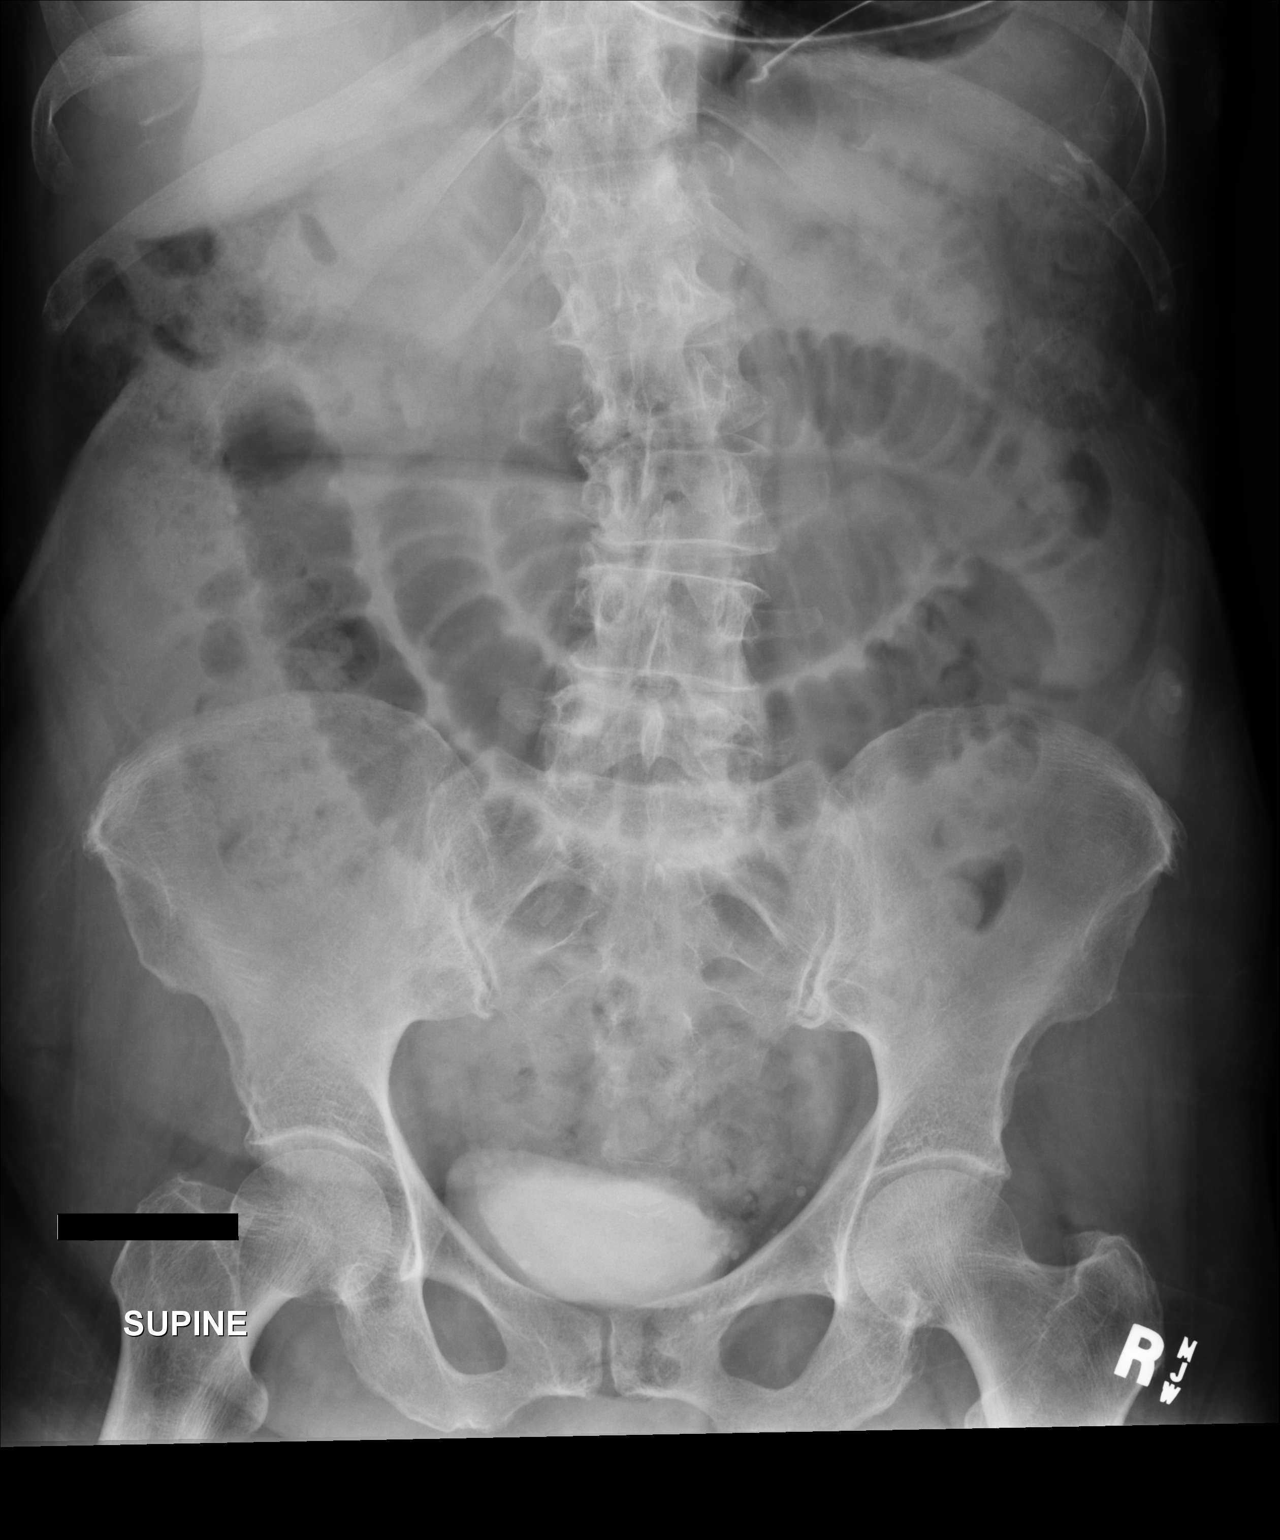

[ap lld]
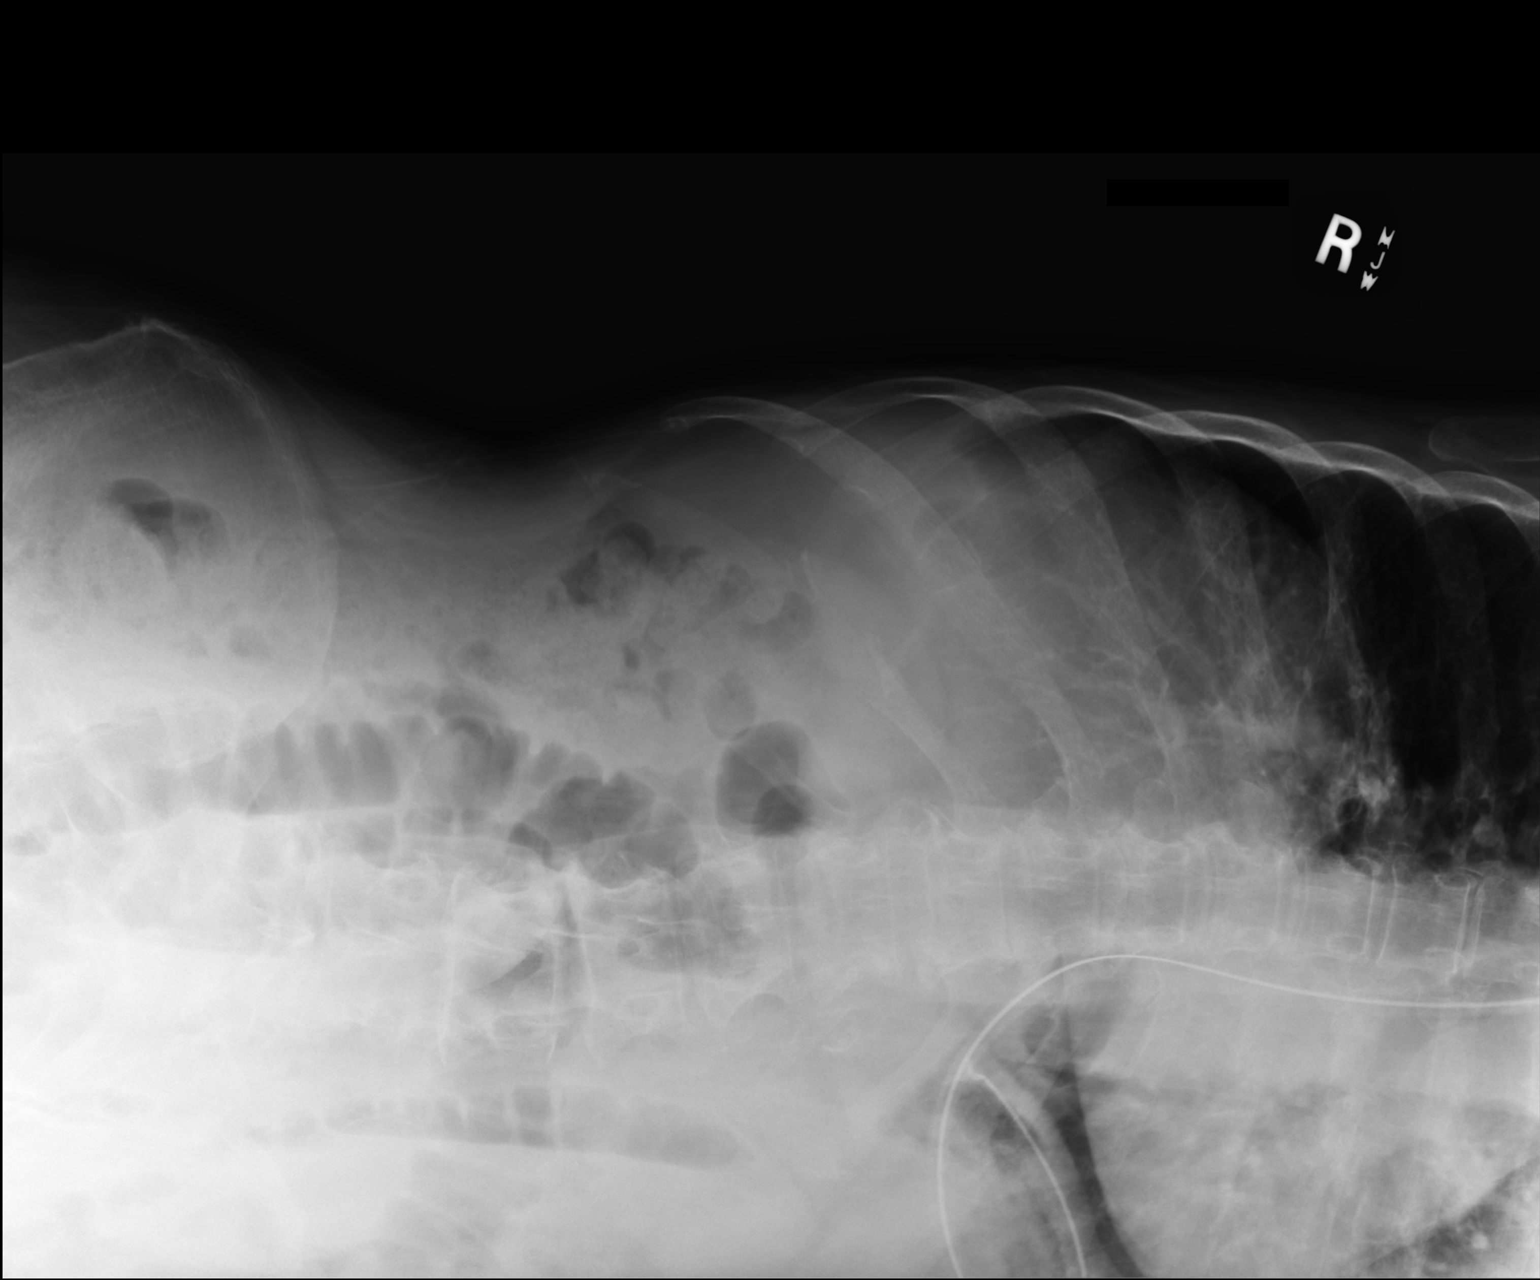

[2 of 2 positions shown; findings below may reference images not displayed]

FINDINGS: Multiple dilated central small bowel loops are identified with
maximal caliber of approximately 3.5 cm. Some air and stool is
present throughout much of the colon. Decubitus film shows no
evidence of free air. Excreted contrast is present in the bladder.
Degenerative changes are present in the lumbar spine.
IMPRESSION: Radiographic pattern consistent with partial small bowel
obstruction. No free intraperitoneal air is identified

## 2015-02-07 IMAGING — DX DG ABD PORTABLE 2V
2 series · 2 of 2 positions shown · non-contrast
Comparison: 02/02/2013

CLINICAL DATA: Recheck partial small bowel obstruction.

EXAM:
PORTABLE ABDOMEN - 2 VIEW

[supine ap]
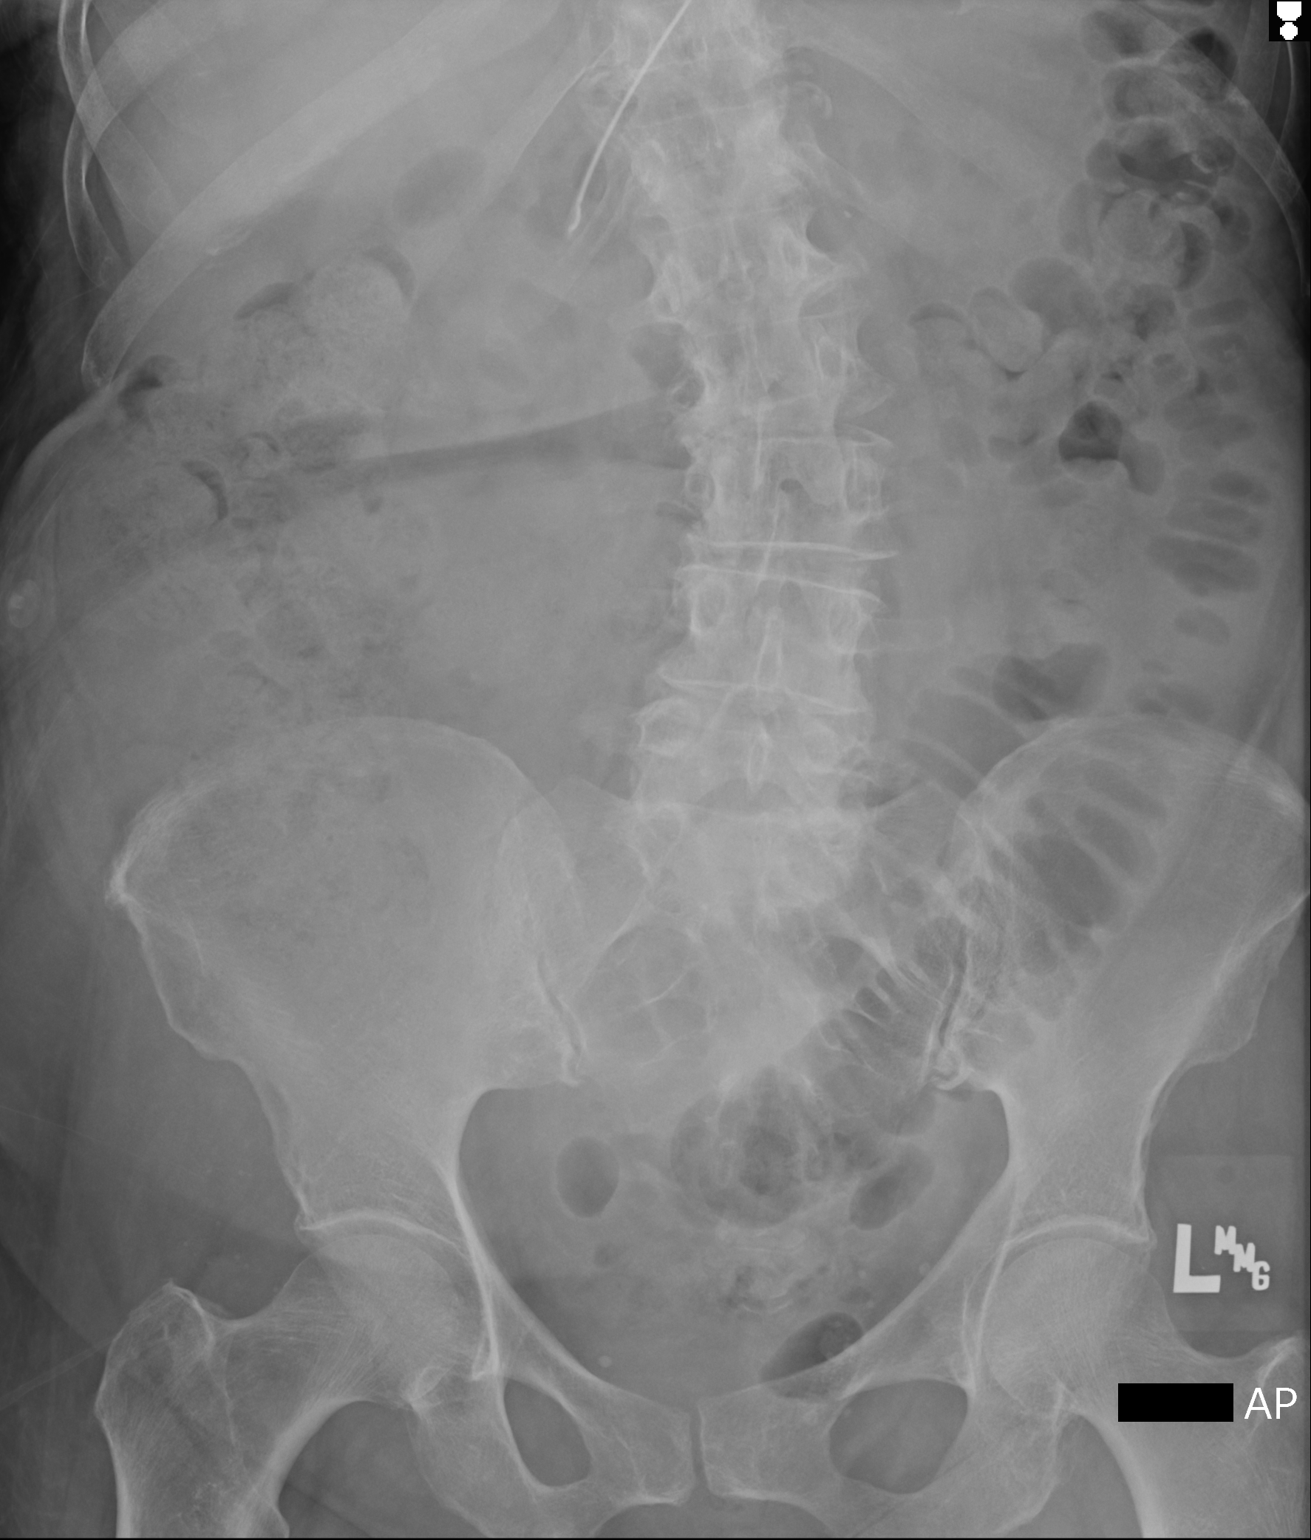

[decub]
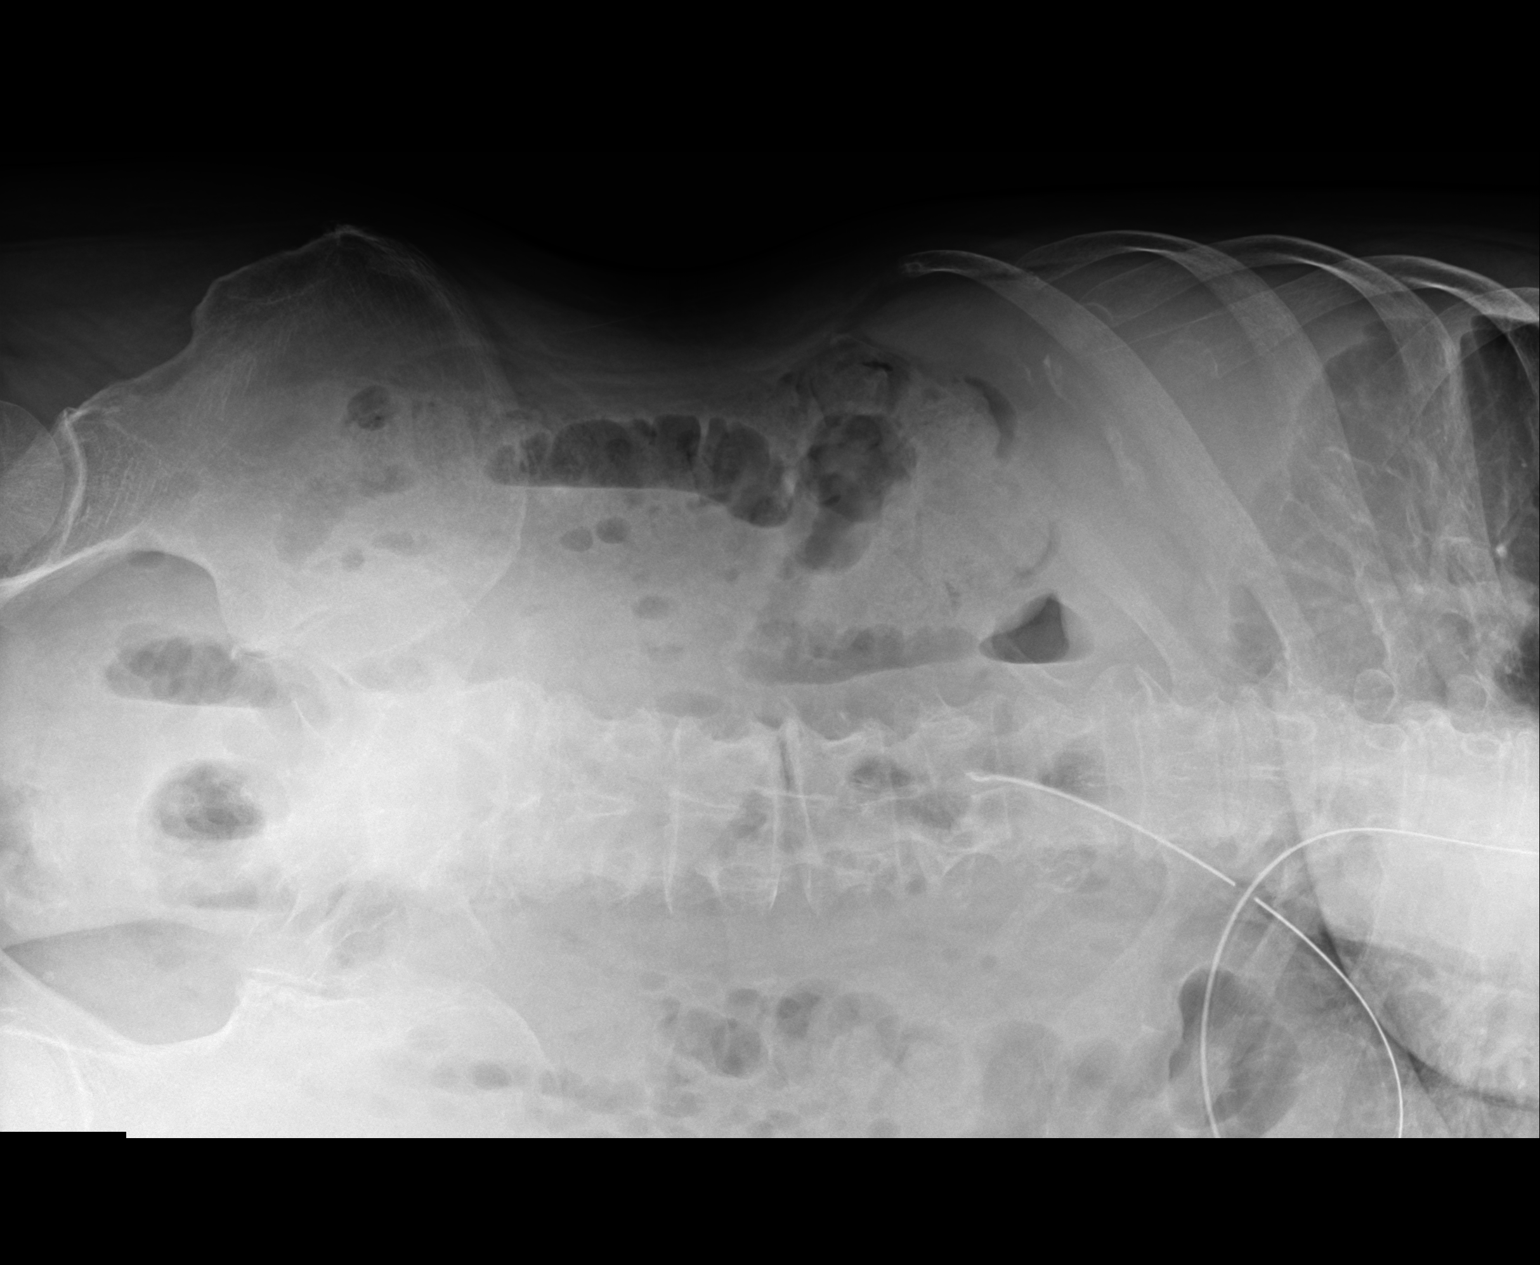

[2 of 2 positions shown; findings below may reference images not displayed]

FINDINGS: NG tube is in the stomach. Improvement in small bowel
distention/dilatation. Continue mild dilatation of left abdominal
and pelvic small bowel loops.
IMPRESSION: Improved small bowel distention, but continued partial small bowel
obstruction pattern.

## 2015-03-22 DIAGNOSIS — R05 Cough: Secondary | ICD-10-CM | POA: Diagnosis not present

## 2016-03-24 DIAGNOSIS — N390 Urinary tract infection, site not specified: Secondary | ICD-10-CM | POA: Diagnosis not present

## 2016-07-30 DIAGNOSIS — S8001XA Contusion of right knee, initial encounter: Secondary | ICD-10-CM | POA: Diagnosis not present

## 2016-07-30 DIAGNOSIS — S322XXA Fracture of coccyx, initial encounter for closed fracture: Secondary | ICD-10-CM | POA: Diagnosis not present

## 2016-07-30 DIAGNOSIS — S39012A Strain of muscle, fascia and tendon of lower back, initial encounter: Secondary | ICD-10-CM | POA: Diagnosis not present

## 2016-07-31 DIAGNOSIS — S8001XA Contusion of right knee, initial encounter: Secondary | ICD-10-CM | POA: Diagnosis not present

## 2016-07-31 DIAGNOSIS — M545 Low back pain: Secondary | ICD-10-CM | POA: Diagnosis not present

## 2017-01-08 DIAGNOSIS — E039 Hypothyroidism, unspecified: Secondary | ICD-10-CM | POA: Diagnosis not present

## 2017-01-08 DIAGNOSIS — J40 Bronchitis, not specified as acute or chronic: Secondary | ICD-10-CM | POA: Diagnosis not present

## 2017-02-07 DIAGNOSIS — Z0001 Encounter for general adult medical examination with abnormal findings: Secondary | ICD-10-CM | POA: Diagnosis not present

## 2017-02-07 DIAGNOSIS — B029 Zoster without complications: Secondary | ICD-10-CM | POA: Diagnosis not present

## 2017-02-26 DIAGNOSIS — R634 Abnormal weight loss: Secondary | ICD-10-CM | POA: Diagnosis not present

## 2017-02-26 DIAGNOSIS — M949 Disorder of cartilage, unspecified: Secondary | ICD-10-CM | POA: Diagnosis not present

## 2017-02-26 DIAGNOSIS — R6881 Early satiety: Secondary | ICD-10-CM | POA: Diagnosis not present

## 2017-02-28 ENCOUNTER — Other Ambulatory Visit: Payer: Self-pay | Admitting: Gastroenterology

## 2017-02-28 DIAGNOSIS — R6881 Early satiety: Secondary | ICD-10-CM

## 2017-02-28 DIAGNOSIS — R634 Abnormal weight loss: Secondary | ICD-10-CM

## 2017-03-07 ENCOUNTER — Other Ambulatory Visit: Payer: Self-pay | Admitting: Gastroenterology

## 2017-03-07 ENCOUNTER — Ambulatory Visit
Admission: RE | Admit: 2017-03-07 | Discharge: 2017-03-07 | Disposition: A | Discharge status: Home / Self Care | Payer: PPO | Source: Ambulatory Visit | Attending: Gastroenterology | Admitting: Gastroenterology

## 2017-03-07 DIAGNOSIS — R6881 Early satiety: Secondary | ICD-10-CM

## 2017-03-07 DIAGNOSIS — R634 Abnormal weight loss: Secondary | ICD-10-CM

## 2017-03-07 DIAGNOSIS — K219 Gastro-esophageal reflux disease without esophagitis: Secondary | ICD-10-CM | POA: Diagnosis not present

## 2018-04-23 DIAGNOSIS — L988 Other specified disorders of the skin and subcutaneous tissue: Secondary | ICD-10-CM | POA: Diagnosis not present

## 2018-06-30 ENCOUNTER — Emergency Department (HOSPITAL_COMMUNITY)
Admission: EM | Admit: 2018-06-30 | Discharge: 2018-06-30 | Disposition: A | Discharge status: Home / Self Care | Payer: PPO | Attending: Emergency Medicine | Admitting: Emergency Medicine

## 2018-06-30 ENCOUNTER — Emergency Department (HOSPITAL_COMMUNITY): Payer: PPO

## 2018-06-30 ENCOUNTER — Other Ambulatory Visit: Payer: Self-pay

## 2018-06-30 ENCOUNTER — Encounter (HOSPITAL_COMMUNITY): Payer: Self-pay | Admitting: Emergency Medicine

## 2018-06-30 DIAGNOSIS — J45909 Unspecified asthma, uncomplicated: Secondary | ICD-10-CM | POA: Diagnosis not present

## 2018-06-30 DIAGNOSIS — S0990XA Unspecified injury of head, initial encounter: Secondary | ICD-10-CM | POA: Diagnosis not present

## 2018-06-30 DIAGNOSIS — W19XXXA Unspecified fall, initial encounter: Secondary | ICD-10-CM

## 2018-06-30 DIAGNOSIS — W01198A Fall on same level from slipping, tripping and stumbling with subsequent striking against other object, initial encounter: Secondary | ICD-10-CM | POA: Insufficient documentation

## 2018-06-30 DIAGNOSIS — Y9301 Activity, walking, marching and hiking: Secondary | ICD-10-CM | POA: Diagnosis not present

## 2018-06-30 DIAGNOSIS — R9431 Abnormal electrocardiogram [ECG] [EKG]: Secondary | ICD-10-CM | POA: Diagnosis not present

## 2018-06-30 DIAGNOSIS — Y929 Unspecified place or not applicable: Secondary | ICD-10-CM | POA: Diagnosis not present

## 2018-06-30 DIAGNOSIS — Y999 Unspecified external cause status: Secondary | ICD-10-CM | POA: Diagnosis not present

## 2018-06-30 DIAGNOSIS — Z79899 Other long term (current) drug therapy: Secondary | ICD-10-CM | POA: Diagnosis not present

## 2018-06-30 DIAGNOSIS — R51 Headache: Secondary | ICD-10-CM | POA: Diagnosis not present

## 2018-06-30 DIAGNOSIS — E860 Dehydration: Secondary | ICD-10-CM | POA: Insufficient documentation

## 2018-06-30 LAB — BASIC METABOLIC PANEL
Anion gap: 8 (ref 5–15)
BUN: 20 mg/dL (ref 8–23)
CO2: 24 mmol/L (ref 22–32)
Calcium: 9 mg/dL (ref 8.9–10.3)
Chloride: 108 mmol/L (ref 98–111)
Creatinine, Ser: 0.77 mg/dL (ref 0.44–1.00)
GFR calc Af Amer: >60 mL/min (ref >60)
GFR calc non Af Amer: >60 mL/min (ref >60)
Glucose, Bld: 165 mg/dL — ABNORMAL HIGH (ref 70–99)
Potassium: 4.3 mmol/L (ref 3.5–5.1)
Sodium: 140 mmol/L (ref 135–145)

## 2018-06-30 LAB — CBC
HCT: 44.6 % (ref 36.0–46.0)
Hemoglobin: 15 g/dL (ref 12.0–15.0)
MCH: 32.8 pg (ref 26.0–34.0)
MCHC: 33.6 g/dL (ref 30.0–36.0)
MCV: 97.6 fL (ref 80.0–100.0)
Platelets: 198 10*3/uL (ref 150–400)
RBC: 4.57 MIL/uL (ref 3.87–5.11)
RDW: 12.9 % (ref 11.5–15.5)
WBC: 10.3 10*3/uL (ref 4.0–10.5)
nRBC: 0 % (ref 0.0–0.2)

## 2018-06-30 LAB — URINALYSIS, ROUTINE W REFLEX MICROSCOPIC
Bilirubin Urine: NEGATIVE
Glucose, UA: 50 mg/dL — AB
Hgb urine dipstick: NEGATIVE
Ketones, ur: 20 mg/dL — AB
Leukocytes,Ua: NEGATIVE
Nitrite: NEGATIVE
Protein, ur: NEGATIVE mg/dL
Specific Gravity, Urine: 1.018 (ref 1.005–1.030)
pH: 6 (ref 5.0–8.0)

## 2018-06-30 LAB — CBG MONITORING, ED: Glucose-Capillary: 138 mg/dL — ABNORMAL HIGH (ref 70–99)

## 2018-06-30 NOTE — ED Provider Notes (Signed)
[facility] Provider Note   CSN: [id number] Arrival date & time: 06/30/18  1320    History   Chief Complaint Chief Complaint  Patient presents with  . Fall  . Headache  . Emesis  . Dizziness    HPI Cathy Martinez is a 85 y.o. female.     HPI   Cathy Martinez is a 85 y.o. female, with a history of asthma, presenting to the ED with a fall that occurred around 4 AM this morning.  She states she got out of bed to use the restroom, caught her foot in the sheet, and tripped, striking her head on the vanity nearby. She states she lives alone and her neighbors checked on her in the morning as they typically do.  They recommended she go to the hospital to "get checked out." She notes a small wound to the right side of her forehead as well as some bruising in the region. Triage note was read, however, patient denies any of these symptoms multiple times. Denies fever/chills, LOC, neck/back pain, chest pain, shortness of breath, headache, neurologic deficits, vision abnormalities, abdominal pain, urinary symptoms, recent illness, cough, N/V/D, or any other complaints.  Tetanus up-to-date within the last 10 years.   Past Medical History:  Diagnosis Date  . Asthma    very rare  . Diverticulosis   . GERD (gastroesophageal reflux disease)    occasional  . H/O bronchitis   . H/O small bowel obstruction 2014  . Pneumonia    hx of 3 years ago  . PONV (postoperative nausea and vomiting)   . Rheumatoid arthritis ([place])   . Sciatica     Patient Active Problem List   Diagnosis Date Noted  . Expected blood loss anemia 03/25/2013  . S/P right TKA 03/23/2013  . Small bowel obstruction due to adhesions ([place]) 02/01/2013    Past Surgical History:  Procedure Laterality Date  . ABDOMINAL HYSTERECTOMY    . BREAST BIOPSY  15-20 years ago  . CATARACT EXTRACTION Bilateral 5 years ago  . HERNIA REPAIR  12 years ago  . TONSILLECTOMY  as child  . TOTAL  KNEE ARTHROPLASTY Right 03/23/2013   Procedure: RIGHT TOTAL KNEE ARTHROPLASTY;  Surgeon: Mauri Pole, MD;  Location: [facility];  Service: Orthopedics;  Laterality: Right;     OB History   No obstetric history on file.      Home Medications    Prior to Admission medications   Medication Sig Start Date End Date Taking? Authorizing Provider  acyclovir (ZOVIRAX) 200 MG capsule Take 200 mg by mouth 2 (two) times daily.    [provider]  albuterol (PROVENTIL HFA;VENTOLIN HFA) 108 (90 BASE) MCG/ACT inhaler Inhale 1 puff into the lungs every 6 (six) hours as needed for wheezing or shortness of breath.    [provider]  carboxymethylcellulose (REFRESH PLUS) 0.5 % SOLN Place 1 drop into both eyes 3 (three) times daily as needed (eye irritation).    [provider]  docusate sodium 100 MG CAPS Take 100 mg by mouth 2 (two) times daily. 03/25/13   Danae Orleans, PA-C  ferrous sulfate 325 (65 FE) MG tablet Take 1 tablet (325 mg total) by mouth 3 (three) times daily after meals. 03/25/13   Danae Orleans, PA-C  HYDROcodone-acetaminophen (NORCO/VICODIN) 5-325 MG per tablet Take 1-2 tablets by mouth every 4 (four) hours as needed for moderate pain. 03/25/13   Danae Orleans, PA-C  Krill Oil 300 MG  CAPS Take 1 capsule by mouth daily.    [provider]  omeprazole (PRILOSEC OTC) 20 MG tablet Take 20 mg by mouth as needed.     [provider]  polyethylene glycol (MIRALAX / GLYCOLAX) packet Take 17 g by mouth 2 (two) times daily. 03/25/13   Danae Orleans, PA-C  psyllium (METAMUCIL) 58.6 % powder Take 1 packet by mouth daily.    [provider]  tiZANidine (ZANAFLEX) 4 MG tablet Take 1 tablet (4 mg total) by mouth every 6 (six) hours as needed for muscle spasms. 03/25/13   Danae Orleans, PA-C  vitamin C (ASCORBIC ACID) 500 MG tablet Take 500 mg by mouth daily.    [provider]    Family History No family history on file.  Social History  Social History   Tobacco Use  . Smoking status: Never Smoker  . Smokeless tobacco: Never Used  Substance Use Topics  . Alcohol use: Yes    Comment: rare  . Drug use: No     Allergies   Patient has no known allergies.   Review of Systems Review of Systems  Constitutional: Negative for chills and fever.  Eyes: Negative for visual disturbance.  Respiratory: Negative for cough and shortness of breath.   Cardiovascular: Negative for chest pain.  Gastrointestinal: Negative for abdominal pain, diarrhea, nausea and vomiting.  Genitourinary: Negative for dysuria, flank pain, frequency and hematuria.  Musculoskeletal: Negative for back pain and neck pain.  Neurological: Negative for dizziness, seizures, syncope, weakness, light-headedness, numbness and headaches.  All other systems reviewed and are negative.    Physical Exam Updated Vital Signs BP (!) 166/61 (BP Location: Right Arm)   Pulse 66   Temp 97.7 F (36.5 C) (Oral)   Resp 14   Wt 51.7 kg   SpO2 98%   BMI 22.26 kg/m   Physical Exam Vitals signs and nursing note reviewed.  Constitutional:      General: She is not in acute distress.    Appearance: She is well-developed. She is not diaphoretic.  HENT:     Head: Normocephalic.     Comments: 0.25 cm laceration/abrasion to the right frontal/parietal region with surrounding bruising.  No deformity or instability.  The remainder of the scalp and face was examined without other areas of injury.    Nose: Nose normal.     Mouth/Throat:     Mouth: Mucous membranes are moist.     Pharynx: Oropharynx is clear.  Eyes:     Extraocular Movements: Extraocular movements intact.     Conjunctiva/sclera: Conjunctivae normal.     Pupils: Pupils are equal, round, and reactive to light.  Neck:     Musculoskeletal: Normal range of motion and neck supple. No neck rigidity.  Cardiovascular:     Rate and Rhythm: Normal rate and regular rhythm.     Pulses: Normal pulses.           Radial pulses are 2+ on the right side and 2+ on the left side.       Posterior tibial pulses are 2+ on the right side and 2+ on the left side.     Heart sounds: Normal heart sounds.     Comments: Tactile temperature in the extremities appropriate and equal bilaterally. Pulmonary:     Effort: Pulmonary effort is normal. No respiratory distress.     Breath sounds: Normal breath sounds.  Abdominal:     Palpations: Abdomen is soft.     Tenderness: There is  no abdominal tenderness. There is no guarding.  Musculoskeletal:     Right lower leg: No edema.     Left lower leg: No edema.     Comments: Movement intact in each of the major joints of the upper and lower extremities without pain or noted difficulty.  Normal motor function intact in all extremities. No midline spinal tenderness.   Overall trauma exam performed without any abnormalities noted other than those mentioned.  Lymphadenopathy:     Cervical: No cervical adenopathy.  Skin:    General: Skin is warm and dry.  Neurological:     Mental Status: She is alert and oriented to person, place, and time.     Comments: Sensation grossly intact to light touch in the extremities. No noted speech deficits. No aphasia. Patient handles oral secretions without difficulty. No noted swallowing defects.  Equal grip strength bilaterally. Strength 5/5 in the upper extremities. Strength 5/5 in the lower extremities. Able to ambulate without assistance or noted gait abnormalities. Coordination intact. Cranial nerves III-XII grossly intact.  No facial droop.   Psychiatric:        Mood and Affect: Mood and affect normal.        Speech: Speech normal.        Behavior: Behavior normal.      ED Treatments / Results  Labs (all labs ordered are listed, but only abnormal results are displayed) Labs Reviewed  BASIC METABOLIC PANEL - Abnormal; Notable for the following components:      Result Value   Glucose, Bld 165 (*)    All other components  within normal limits  URINALYSIS, ROUTINE W REFLEX MICROSCOPIC - Abnormal; Notable for the following components:   Glucose, UA 50 (*)    Ketones, ur 20 (*)    All other components within normal limits  CBG MONITORING, ED - Abnormal; Notable for the following components:   Glucose-Capillary 138 (*)    All other components within normal limits  CBC    EKG EKG Interpretation  Date/Time:  Monday Jun 30 2018 13:40:06 EDT Ventricular Rate:  67 PR Interval:  146 QRS Duration: 80 QT Interval:  392 QTC Calculation: 414 R Axis:   35 Text Interpretation:  Normal sinus rhythm with sinus arrhythmia Nonspecific ST and T wave abnormality Abnormal ECG No significant change since last tracing Confirmed by Duffy Bruce [phone]) on 06/30/2018 2:26:26 PM Also confirmed by Duffy Bruce [phone]), editor Shon Hale [phone])  on 07/01/2018 8:11:33 AM   Radiology Ct Head Wo Contrast  Result Date: 06/30/2018 CLINICAL DATA:  Headache and dizziness following fall EXAM: CT HEAD WITHOUT CONTRAST TECHNIQUE: Contiguous axial images were obtained from the base of the skull through the vertex without intravenous contrast. COMPARISON:  None. FINDINGS: Brain: There is age related volume loss. There is no intracranial mass, hemorrhage, extra-axial fluid collection, or midline shift. There is slight small vessel disease in the centra semiovale bilaterally. No acute infarct is demonstrable on this study. Vascular: No hyperdense vessel evident. There is calcification in each carotid siphon region. Skull: There is a right frontal scalp hematoma. Bony calvarium appears intact. Sinuses/Orbits: Visualized paranasal sinuses are clear. Visualized orbits appear symmetric bilaterally. Evidence of previous cataract removals bilaterally. Other: Mastoid air cells are clear. IMPRESSION: Age related volume loss with slight periventricular small vessel disease. No acute infarct. No mass or hemorrhage. There are foci of arterial  vascular calcification. There is a right frontal scalp hematoma without underlying fracture. Electronically Signed   By: Gwyndolyn Saxon  Jasmine December III M.D.   On: 06/30/2018 15:49    Procedures Procedures (including critical care time)  Medications Ordered in ED Medications - No data to display   Initial Impression / Assessment and Plan / ED Course  I have reviewed the triage vital signs and the nursing notes.  Pertinent labs & imaging results that were available during my care of the patient were reviewed by me and considered in my medical decision making (see chart for details).        Patient presents following a mechanical fall.  She has no current complaints.  She has a small, superficial appearing laceration/abrasion to the scalp that is already showing signs of closure.  Head CT without acute abnormality.  She does have some evidence of dehydration.  I discussed rehydration options with her, shared decision-making was used, and we decided upon oral hydration. Patient lives alone, however, she will have friends and family checking on her.  She will also follow-up with her PCP within next few days. The patient was given instructions for home care as well as return precautions. Patient voices understanding of these instructions, accepts the plan, and is comfortable with discharge.   During the patient's ED course, I spoke with the patient's son and POA, Mel. We discussed patient's presentation, exam findings, imaging and lab results, follow up, and return precautions.    Findings and plan of care discussed with Duffy Bruce, MD. Dr. Ellender Hose personally evaluated and examined this patient.  Vitals:   06/30/18 1500 06/30/18 1530 06/30/18 1600 06/30/18 1626  BP: (!) 154/77 (!) 151/74 (!) 148/76 (!) 148/76  Pulse: 72 69 72 92  Resp: 16 15 17 18   Temp:    98 F (36.7 C)  TempSrc:    Oral  SpO2: 98% 96% 98% 98%  Weight:      Height:         Final Clinical Impressions(s) / ED Diagnoses    Final diagnoses:  Fall, initial encounter  Injury of head, initial encounter  Dehydration    ED Discharge Orders    None       Layla Maw 07/01/18 1525    Duffy Bruce, MD 07/01/18 2216

## 2018-06-30 NOTE — Discharge Instructions (Addendum)
There was evidence of dehydration on the lab work.  Be sure to stay well-hydrated by drinking plenty water.  Head Injury You have been seen today for a head injury. It does not appear to be serious at this time.  Close observation: The close observation period is usually 6 hours from the injury. This includes staying awake and having a trustworthy adult monitor you to assure your condition does not worsen. You should be in regular contact with this person and ideally, they should be able to monitor you in person.  Secondary observation: The secondary observation period is usually 24 hours from the injury. You are allowed to sleep during this time. A trustworthy adult should intermittently monitor you to assure your condition does not worsen.   Overall head injury/concussion care: Rest: Be sure to get plenty of rest. You will need more rest and sleep while you recover. Hydration: Be sure to stay well hydrated by drinking plenty of water. Pain: Tylenol for pain.  Return to sports and activities: In general, you may return to normal activities once symptoms have subsided, however, you would ideally be cleared by a primary care provider or other qualified medical professional prior to return to these activities.  Follow up: Follow up with the concussion clinic or your primary care provider for further management of this issue. Return: Return to the ED should you begin to have confusion, abnormal behavior, aggression, violence, or personality changes, repeated vomiting, vision loss, numbness or weakness on one side of the body, difficulty standing due to dizziness, significantly worsening pain, or any other major concerns.   Wound Care - General Wound Cleaning: Clean the wound and surrounding area gently with tap water and mild soap. Rinse well and blot dry. Do not scrub the wound, as this may cause the wound edges to come apart. You may shower, but avoid submerging the wound, such as with a bath or  swimming.  Clean the wound daily to prevent infection.  Do not use cleaners such as hydrogen peroxide or alcohol.   Scar reduction: Application of a topical antibiotic ointment, such as Neosporin, after the wound has begun to close and heal well can decrease scab formation and reduce scarring. After the wound has healed, application of ointments such as Aquaphor can also reduce scar formation.  The key to scar reduction is keeping the skin well hydrated and supple. Drinking plenty of water throughout the day (At least eight 8oz glasses of water a day) is essential to staying well hydrated.  Sun exposure: Keep the wound out of the sun. After the wound has healed, continue to protect it from the sun by wearing protective clothing or applying sunscreen.  Return: Return to the ED should signs of infection arise, such as spreading redness, puffiness/swelling, pus draining from the wound, severe increase in pain, fever over 100.87F, or any other major issues.  For prescription assistance, may try using prescription discount sites or apps, such as goodrx.com

## 2018-06-30 NOTE — ED Notes (Signed)
Pt is back from CT scan  

## 2018-06-30 NOTE — ED Notes (Signed)
Updated patients family member, Kathlee Nations, that patient has just gotten back to a room, we are waiting results of head CT. Care team will be in contact when more information is available.

## 2018-06-30 NOTE — ED Triage Notes (Signed)
Pt in from home with c/o HA, dizziness and head lac to R frontal area after falling while trying to get up to go to bathroom last night. States she hit her head on the corner of her dresser, denies any LOC. Did have one episode of n/v this am. Not on thinners

## 2018-06-30 NOTE — ED Notes (Signed)
Pt son Mel [phone]

## 2018-06-30 NOTE — ED Notes (Addendum)
PA shaun Joy spoke to pts son Mel Edison Pace in [place] and I spoke to son about pts neg CT sACN,  He was given what to look for in terms of concussion . Spoke to him about getting in with dr office about having someone to check on his mom besides friends since they were concerned about her living alone with memory issues,, pt d/c eith her friends that had sons permission to take pt home  Lib DODQV  [phone]

## 2018-09-04 DIAGNOSIS — L821 Other seborrheic keratosis: Secondary | ICD-10-CM | POA: Diagnosis not present

## 2018-09-04 DIAGNOSIS — D1801 Hemangioma of skin and subcutaneous tissue: Secondary | ICD-10-CM | POA: Diagnosis not present

## 2018-09-04 DIAGNOSIS — L814 Other melanin hyperpigmentation: Secondary | ICD-10-CM | POA: Diagnosis not present

## 2019-03-12 ENCOUNTER — Ambulatory Visit: Payer: PPO | Attending: Internal Medicine

## 2019-03-12 DIAGNOSIS — Z23 Encounter for immunization: Secondary | ICD-10-CM | POA: Insufficient documentation

## 2019-03-12 NOTE — Progress Notes (Signed)
   Covid-19 Vaccination Clinic  Name:  Cathy Martinez    MRN: [id number] DOB: [birth year]  03/12/2019  Cathy Martinez was observed post Covid-19 immunization for 15 minutes without incidence. She was provided with Vaccine Information Sheet and instruction to access the V-Safe system.   Cathy Martinez was instructed to call 911 with any severe reactions post vaccine: Marland Kitchen Difficulty breathing  . Swelling of your face and throat  . A fast heartbeat  . A bad rash all over your body  . Dizziness and weakness

## 2019-03-31 ENCOUNTER — Ambulatory Visit: Payer: PPO | Attending: Internal Medicine

## 2019-03-31 DIAGNOSIS — Z23 Encounter for immunization: Secondary | ICD-10-CM | POA: Insufficient documentation

## 2019-03-31 NOTE — Progress Notes (Signed)
   Covid-19 Vaccination Clinic  Name:  Cathy Martinez    MRN: [id number] DOB: [birth year]  03/31/2019  Cathy Martinez was observed post Covid-19 immunization for 15 minutes without incidence. She was provided with Vaccine Information Sheet and instruction to access the V-Safe system.   Cathy Martinez was instructed to call 911 with any severe reactions post vaccine: Marland Kitchen Difficulty breathing  . Swelling of your face and throat  . A fast heartbeat  . A bad rash all over your body  . Dizziness and weakness    Immunizations Administered    Name Date Dose VIS Date Route   Pfizer COVID-19 Vaccine 03/31/2019 10:07 AM 0.3 mL 01/30/2019 Intramuscular   Manufacturer: [place]   Lot: [id number]   [place]: [id number]

## 2020-02-23 ENCOUNTER — Inpatient Hospital Stay (HOSPITAL_COMMUNITY)
Admission: EM | Admit: 2020-02-23 | Discharge: 2020-02-26 | DRG: 481 | Disposition: A | Discharge status: Skilled Nursing Facility | Payer: PPO | Attending: Family Medicine | Admitting: Family Medicine

## 2020-02-23 ENCOUNTER — Emergency Department (HOSPITAL_COMMUNITY): Payer: PPO

## 2020-02-23 ENCOUNTER — Encounter (HOSPITAL_COMMUNITY): Payer: Self-pay

## 2020-02-23 DIAGNOSIS — I10 Essential (primary) hypertension: Secondary | ICD-10-CM | POA: Diagnosis not present

## 2020-02-23 DIAGNOSIS — Z79899 Other long term (current) drug therapy: Secondary | ICD-10-CM

## 2020-02-23 DIAGNOSIS — R5381 Other malaise: Secondary | ICD-10-CM | POA: Diagnosis not present

## 2020-02-23 DIAGNOSIS — W010XXA Fall on same level from slipping, tripping and stumbling without subsequent striking against object, initial encounter: Secondary | ICD-10-CM | POA: Diagnosis present

## 2020-02-23 DIAGNOSIS — Z96651 Presence of right artificial knee joint: Secondary | ICD-10-CM | POA: Diagnosis present

## 2020-02-23 DIAGNOSIS — M069 Rheumatoid arthritis, unspecified: Secondary | ICD-10-CM | POA: Diagnosis not present

## 2020-02-23 DIAGNOSIS — Z20822 Contact with and (suspected) exposure to covid-19: Secondary | ICD-10-CM | POA: Diagnosis not present

## 2020-02-23 DIAGNOSIS — S72142A Displaced intertrochanteric fracture of left femur, initial encounter for closed fracture: Secondary | ICD-10-CM

## 2020-02-23 DIAGNOSIS — Z4889 Encounter for other specified surgical aftercare: Secondary | ICD-10-CM | POA: Diagnosis not present

## 2020-02-23 DIAGNOSIS — F0390 Unspecified dementia without behavioral disturbance: Secondary | ICD-10-CM | POA: Diagnosis not present

## 2020-02-23 DIAGNOSIS — K219 Gastro-esophageal reflux disease without esophagitis: Secondary | ICD-10-CM | POA: Diagnosis not present

## 2020-02-23 DIAGNOSIS — Y92009 Unspecified place in unspecified non-institutional (private) residence as the place of occurrence of the external cause: Secondary | ICD-10-CM

## 2020-02-23 DIAGNOSIS — R4181 Age-related cognitive decline: Secondary | ICD-10-CM | POA: Diagnosis not present

## 2020-02-23 DIAGNOSIS — J45909 Unspecified asthma, uncomplicated: Secondary | ICD-10-CM | POA: Diagnosis not present

## 2020-02-23 DIAGNOSIS — Z880 Allergy status to penicillin: Secondary | ICD-10-CM

## 2020-02-23 DIAGNOSIS — Z9889 Other specified postprocedural states: Secondary | ICD-10-CM | POA: Diagnosis not present

## 2020-02-23 DIAGNOSIS — R9431 Abnormal electrocardiogram [ECG] [EKG]: Secondary | ICD-10-CM | POA: Diagnosis not present

## 2020-02-23 DIAGNOSIS — S72002A Fracture of unspecified part of neck of left femur, initial encounter for closed fracture: Secondary | ICD-10-CM

## 2020-02-23 DIAGNOSIS — S0990XA Unspecified injury of head, initial encounter: Secondary | ICD-10-CM | POA: Diagnosis not present

## 2020-02-23 DIAGNOSIS — Z885 Allergy status to narcotic agent status: Secondary | ICD-10-CM | POA: Diagnosis not present

## 2020-02-23 DIAGNOSIS — W19XXXA Unspecified fall, initial encounter: Secondary | ICD-10-CM | POA: Diagnosis not present

## 2020-02-23 DIAGNOSIS — D62 Acute posthemorrhagic anemia: Secondary | ICD-10-CM | POA: Diagnosis not present

## 2020-02-23 DIAGNOSIS — Z419 Encounter for procedure for purposes other than remedying health state, unspecified: Secondary | ICD-10-CM

## 2020-02-23 DIAGNOSIS — Z043 Encounter for examination and observation following other accident: Secondary | ICD-10-CM | POA: Diagnosis not present

## 2020-02-23 DIAGNOSIS — R52 Pain, unspecified: Secondary | ICD-10-CM | POA: Diagnosis not present

## 2020-02-23 DIAGNOSIS — Z01818 Encounter for other preprocedural examination: Secondary | ICD-10-CM | POA: Diagnosis not present

## 2020-02-23 DIAGNOSIS — R0902 Hypoxemia: Secondary | ICD-10-CM | POA: Diagnosis not present

## 2020-02-23 DIAGNOSIS — S7292XA Unspecified fracture of left femur, initial encounter for closed fracture: Secondary | ICD-10-CM | POA: Diagnosis present

## 2020-02-23 DIAGNOSIS — Z7982 Long term (current) use of aspirin: Secondary | ICD-10-CM | POA: Diagnosis not present

## 2020-02-23 DIAGNOSIS — S79912A Unspecified injury of left hip, initial encounter: Secondary | ICD-10-CM | POA: Diagnosis not present

## 2020-02-23 DIAGNOSIS — W19XXXD Unspecified fall, subsequent encounter: Secondary | ICD-10-CM | POA: Diagnosis not present

## 2020-02-23 DIAGNOSIS — S7222XA Displaced subtrochanteric fracture of left femur, initial encounter for closed fracture: Secondary | ICD-10-CM | POA: Diagnosis not present

## 2020-02-23 DIAGNOSIS — J449 Chronic obstructive pulmonary disease, unspecified: Secondary | ICD-10-CM | POA: Diagnosis not present

## 2020-02-23 DIAGNOSIS — S72142D Displaced intertrochanteric fracture of left femur, subsequent encounter for closed fracture with routine healing: Secondary | ICD-10-CM | POA: Diagnosis not present

## 2020-02-23 DIAGNOSIS — M5430 Sciatica, unspecified side: Secondary | ICD-10-CM | POA: Diagnosis not present

## 2020-02-23 DIAGNOSIS — K565 Intestinal adhesions [bands], unspecified as to partial versus complete obstruction: Secondary | ICD-10-CM | POA: Diagnosis not present

## 2020-02-23 DIAGNOSIS — S72102A Unspecified trochanteric fracture of left femur, initial encounter for closed fracture: Secondary | ICD-10-CM | POA: Diagnosis not present

## 2020-02-23 DIAGNOSIS — K5790 Diverticulosis of intestine, part unspecified, without perforation or abscess without bleeding: Secondary | ICD-10-CM | POA: Diagnosis not present

## 2020-02-23 DIAGNOSIS — Z7401 Bed confinement status: Secondary | ICD-10-CM | POA: Diagnosis not present

## 2020-02-23 DIAGNOSIS — M2550 Pain in unspecified joint: Secondary | ICD-10-CM | POA: Diagnosis not present

## 2020-02-23 DIAGNOSIS — Z4789 Encounter for other orthopedic aftercare: Secondary | ICD-10-CM | POA: Diagnosis not present

## 2020-02-23 LAB — BASIC METABOLIC PANEL
Anion gap: 11 (ref 5–15)
BUN: 16 mg/dL (ref 8–23)
CO2: 24 mmol/L (ref 22–32)
Calcium: 8.8 mg/dL — ABNORMAL LOW (ref 8.9–10.3)
Chloride: 103 mmol/L (ref 98–111)
Creatinine, Ser: 0.67 mg/dL (ref 0.44–1.00)
GFR, Estimated: >60 mL/min (ref >60)
Glucose, Bld: 161 mg/dL — ABNORMAL HIGH (ref 70–99)
Potassium: 3.9 mmol/L (ref 3.5–5.1)
Sodium: 138 mmol/L (ref 135–145)

## 2020-02-23 LAB — CBC WITH DIFFERENTIAL/PLATELET
Abs Immature Granulocytes: 0.05 10*3/uL (ref 0.00–0.07)
Basophils Absolute: 0.1 10*3/uL (ref 0.0–0.1)
Basophils Relative: 0 %
Eosinophils Absolute: 0.1 10*3/uL (ref 0.0–0.5)
Eosinophils Relative: 1 %
HCT: 44.9 % (ref 36.0–46.0)
Hemoglobin: 14.9 g/dL (ref 12.0–15.0)
Immature Granulocytes: 0 %
Lymphocytes Relative: 13 %
Lymphs Abs: 1.6 10*3/uL (ref 0.7–4.0)
MCH: 33 pg (ref 26.0–34.0)
MCHC: 33.2 g/dL (ref 30.0–36.0)
MCV: 99.6 fL (ref 80.0–100.0)
Monocytes Absolute: 0.9 10*3/uL (ref 0.1–1.0)
Monocytes Relative: 8 %
Neutro Abs: 9.6 10*3/uL — ABNORMAL HIGH (ref 1.7–7.7)
Neutrophils Relative %: 78 %
Platelets: 223 10*3/uL (ref 150–400)
RBC: 4.51 MIL/uL (ref 3.87–5.11)
RDW: 13.4 % (ref 11.5–15.5)
WBC: 12.3 10*3/uL — ABNORMAL HIGH (ref 4.0–10.5)
nRBC: 0 % (ref 0.0–0.2)

## 2020-02-23 LAB — PROTIME-INR
INR: 1.1 (ref 0.8–1.2)
Prothrombin Time: 13.8 seconds (ref 11.4–15.2)

## 2020-02-23 MED ORDER — FENTANYL CITRATE (PF) 100 MCG/2ML IJ SOLN
100.0000 ug | INTRAMUSCULAR | Status: DC | PRN
  Administered 2020-02-23 – 2020-02-24 (×7): 100 ug via INTRAVENOUS
  Filled 2020-02-23 (×7): qty 2

## 2020-02-23 MED ORDER — ENOXAPARIN SODIUM 30 MG/0.3ML ~~LOC~~ SOLN
30.0000 mg | Freq: Once | SUBCUTANEOUS | Status: AC
  Administered 2020-02-24: 30 mg via SUBCUTANEOUS
  Filled 2020-02-23: qty 0.3

## 2020-02-23 NOTE — ED Notes (Signed)
Pt transported to xray 

## 2020-02-23 NOTE — Progress Notes (Signed)
Consult received for LEFT reverse obliquity intertrochanteric femur fracture. EDP has consulted hospitalist for admission. Plan for surgery tomorrow, IMN Left femur. OTO Lovenox ordered, then hold chemical DVT ppx. NPO after MN. Full consult to follow.

## 2020-02-23 NOTE — ED Provider Notes (Signed)
[facility] DEPT Provider Note   CSN: [id number] Arrival date & time: 02/23/20  2048     History No chief complaint on file.   Cathy Martinez is a 87 y.o. female.  HPI Patient presents via EMS.  History obtained by those individuals and also patient herself. She presents with new left hip pain. Patient was well, when she had mechanical fall, landing on her left hip. Since that time she has had severe pain focally in the left posterior hip, worse with motion.  Pain is improved somewhat with 200 mcg of fentanyl provided in route. Patient denies head pain, neck pain, other complaints of pain anywhere. She was briefly in a cervical spine collar, but removed in route. Patient does not take blood thinning medication.    Past Medical History:  Diagnosis Date  . Asthma    very rare  . Diverticulosis   . GERD (gastroesophageal reflux disease)    occasional  . H/O bronchitis   . H/O small bowel obstruction 2014  . Pneumonia    hx of 3 years ago  . PONV (postoperative nausea and vomiting)   . Rheumatoid arthritis ([place])   . Sciatica     Patient Active Problem List   Diagnosis Date Noted  . Expected blood loss anemia 03/25/2013  . S/P right TKA 03/23/2013  . Small bowel obstruction due to adhesions ([place]) 02/01/2013    Past Surgical History:  Procedure Laterality Date  . ABDOMINAL HYSTERECTOMY    . BREAST BIOPSY  15-20 years ago  . CATARACT EXTRACTION Bilateral 5 years ago  . HERNIA REPAIR  12 years ago  . TONSILLECTOMY  as child  . TOTAL KNEE ARTHROPLASTY Right 03/23/2013   Procedure: RIGHT TOTAL KNEE ARTHROPLASTY;  Surgeon: Mauri Pole, MD;  Location: [facility] ORS;  Service: Orthopedics;  Laterality: Right;     OB History   No obstetric history on file.     History reviewed. No pertinent family history.  Social History   Tobacco Use  . Smoking status: Never Smoker  . Smokeless tobacco: Never Used  Substance Use Topics  . Alcohol use:  Yes    Comment: rare  . Drug use: No    Home Medications Prior to Admission medications   Medication Sig Start Date End Date Taking? Authorizing Provider  albuterol (PROVENTIL HFA;VENTOLIN HFA) 108 (90 BASE) MCG/ACT inhaler Inhale 1 puff into the lungs every 6 (six) hours as needed for wheezing or shortness of breath.   Yes [provider]  carboxymethylcellulose (REFRESH PLUS) 0.5 % SOLN Place 1 drop into both eyes 3 (three) times daily as needed (eye irritation).   Yes [provider]  Javier Docker Oil 300 MG CAPS Take 1 capsule by mouth daily.   Yes [provider]  omeprazole (PRILOSEC OTC) 20 MG tablet Take 20 mg by mouth as needed (heart burn).   Yes [provider]  psyllium (METAMUCIL) 58.6 % powder Take 1 packet by mouth daily.   Yes [provider]  vitamin C (ASCORBIC ACID) 500 MG tablet Take 500 mg by mouth daily.   Yes [provider]  tiZANidine (ZANAFLEX) 4 MG tablet Take 1 tablet (4 mg total) by mouth every 6 (six) hours as needed for muscle spasms. Patient not taking: No sig reported 03/25/13   Danae Orleans, PA-C    Allergies    Codeine and Penicillins  Review of Systems   Review of Systems  Constitutional:  Per HPI, otherwise negative  HENT:       Per HPI, otherwise negative  Respiratory:       Per HPI, otherwise negative  Cardiovascular:       Per HPI, otherwise negative  Gastrointestinal: Negative for vomiting.  Endocrine:       Negative aside from HPI  Genitourinary:       Neg aside from HPI   Musculoskeletal:       Per HPI, otherwise negative  Skin: Negative.   Neurological: Negative for syncope.    Physical Exam Updated Vital Signs BP (!) 143/59 (BP Location: Right Arm)   Pulse 73   Temp 97.6 F (36.4 C) (Oral)   Resp (!) 22   SpO2 95%   Physical Exam Vitals and nursing note reviewed.  Constitutional:      General: She is not in acute distress.    Appearance: She is well-developed and  well-nourished.  HENT:     Head: Normocephalic and atraumatic.  Eyes:     Extraocular Movements: EOM normal.     Conjunctiva/sclera: Conjunctivae normal.  Cardiovascular:     Rate and Rhythm: Normal rate and regular rhythm.  Pulmonary:     Effort: Pulmonary effort is normal. No respiratory distress.     Breath sounds: Normal breath sounds. No stridor.  Abdominal:     General: There is no distension.  Musculoskeletal:        General: No edema.     Right hip: Normal.     Left hip: Tenderness and bony tenderness present. Decreased range of motion.       Legs:  Skin:    General: Skin is warm and dry.  Neurological:     Mental Status: She is alert and oriented to person, place, and time.     Cranial Nerves: No cranial nerve deficit.  Psychiatric:        Mood and Affect: Mood and affect normal.      ED Results / Procedures / Treatments   Labs (all labs ordered are listed, but only abnormal results are displayed) Labs Reviewed  BASIC METABOLIC PANEL - Abnormal; Notable for the following components:      Result Value   Glucose, Bld 161 (*)    Calcium 8.8 (*)    All other components within normal limits  CBC WITH DIFFERENTIAL/PLATELET - Abnormal; Notable for the following components:   WBC 12.3 (*)    Neutro Abs 9.6 (*)    All other components within normal limits  SARS CORONAVIRUS 2 (TAT 6-24 HRS)  PROTIME-INR  TYPE AND SCREEN    EKG EKG Interpretation  Date/Time:  Tuesday February 23 2020 21:31:22 EST Ventricular Rate:  68 PR Interval:    QRS Duration: 92 QT Interval:  414 QTC Calculation: 441 R Axis:   48 Text Interpretation: Sinus rhythm Artifact ST-t wave abnormality No significant change since last tracing Abnormal ECG Confirmed by Gerhard Munch [phone]) on 02/23/2020 9:49:13 PM   Radiology DG Chest Port 1 View  Result Date: 02/23/2020 CLINICAL DATA:  Preoperative assessment for hip fracture EXAM: PORTABLE CHEST 1 VIEW COMPARISON:  02/01/2013 FINDINGS: Single  frontal view of the chest demonstrates an unremarkable cardiac silhouette. No airspace disease, effusion, or pneumothorax. No acute bony abnormalities. IMPRESSION: 1. No acute intrathoracic process. Electronically Signed   By: Sharlet Salina M.D.   On: 02/23/2020 22:31   DG Hip Unilat With Pelvis 2-3 Views Left  Result Date: 02/23/2020 CLINICAL DATA:  87 year old female with fall.  EXAM: DG HIP (WITH OR WITHOUT PELVIS) 2-3V LEFT COMPARISON:  Pelvic radiograph dated 06/21/2003. FINDINGS: There is a displaced and angulated fracture of the left femoral neck with trochanteric and subtrochanteric extension. There is varus angulation. No dislocation. Mild osteopenia. The soft tissues are unremarkable. IMPRESSION: Displaced and angulated left femoral neck fracture. Electronically Signed   By: Anner Crete M.D.   On: 02/23/2020 22:19    Procedures Procedures (including critical care time)  Medications Ordered in ED Medications  fentaNYL (SUBLIMAZE) injection 100 mcg (100 mcg Intravenous Given 02/23/20 2209)  enoxaparin (LOVENOX) injection 30 mg (has no administration in time range)    ED Course  I have reviewed the triage vital signs and the nursing notes.  Pertinent labs & imaging results that were available during my care of the patient were reviewed by me and considered in my medical decision making (see chart for details).    MDM Rules/Calculators/A&P                          11:00 PM On repeat exam the patient is calm. We discussed today's findings, including x-ray demonstrating left femoral neck fracture.  Chest x-ray reassuring, I discussed and reviewed both of these. Patient's labs unremarkable aside from mild leukocytosis, likely reactive. I discussed the patient's case with our orthopedic team, plan is for surgery tomorrow. Our hospitalist colleagues will admit the patient for preoperative planning. I discussed the patient's case with her daughter to inform her of results as  well.  MDM Number of Diagnoses or Management Options   Amount and/or Complexity of Data Reviewed Clinical lab tests: reviewed Tests in the radiology section of CPT: reviewed Tests in the medicine section of CPT: reviewed Decide to obtain previous medical records or to obtain history from someone other than the patient: yes Obtain history from someone other than the patient: yes Review and summarize past medical records: yes Discuss the patient with other providers: yes Independent visualization of images, tracings, or specimens: yes  Risk of Complications, Morbidity, and/or Mortality Presenting problems: high Diagnostic procedures: high Management options: high  Critical Care Total time providing critical care: < 30 minutes  Patient Progress Patient progress: stable  Final Clinical Impression(s) / ED Diagnoses Final diagnoses:  Fall, initial encounter  Closed fracture of neck of left femur, initial encounter ([place])      Carmin Muskrat, MD 02/23/20 2302

## 2020-02-23 NOTE — ED Triage Notes (Signed)
Pt is from home and slipped on water and fell on her left hip, EMS reports that her left leg is shorter and they see rotation Pt usually walks around without assistance EMS placed a 20 gauge in left AC, total pain med given by EMS fentanyl 200 mcgs, last dose on arrival to ED

## 2020-02-23 NOTE — H&P (Signed)
History and Physical   Cathy Martinez [id number] DOB: 05-Oct-1931 DOA: 02/23/2020  Referring MD/NP/PA: Dr. Vanita Panda  PCP: Tamsen Roers, MD   Outpatient Specialists: Paralee Cancel, orthopedics,  Patient coming from: Home  Chief Complaint: Fall with left hip pain  HPI: Cathy Martinez is a 87 y.o. female with medical history significant of mild dementia, GERD, mild COPD, rheumatoid arthritis, history of sciatica, asthma and diverticular disease who lives alone. Apparently sustained a mechanical fall today when she turned around slipped and fell. Patient was brought to the ER with significant pain on her left hip site. No nausea vomiting or diarrhea. Work-up shows displaced femoral neck fracture on the left. Orthopedic consulted. Patient being admitted for surgical repair hopefully tomorrow. She has pain at 7 out of 10. No other significant complaints. No fever or chills no nausea vomiting or diarrhea. Patient did not hit her head after the fall..  ED Course: Temperature 97.6 blood pressure 155/63 pulse 83 respirate 23 and oxygen sat 89% on room air. White count 12.3 the rest of the chemistry within normal CBC all within normal. Calcium 8.8. Glucose 161 INR 1.1. Left hip x-ray showed angulated displaced femoral neck fracture. COVID-19 screen is currently pending. Dr. Lyla Glassing of orthopedics consulted and will prepare patient for post surgical repair tomorrow  Review of Systems: As per HPI otherwise 10 point review of systems negative.    Past Medical History:  Diagnosis Date  . Asthma    very rare  . Diverticulosis   . GERD (gastroesophageal reflux disease)    occasional  . H/O bronchitis   . H/O small bowel obstruction 2014  . Pneumonia    hx of 3 years ago  . PONV (postoperative nausea and vomiting)   . Rheumatoid arthritis ([place])   . Sciatica     Past Surgical History:  Procedure Laterality Date  . ABDOMINAL HYSTERECTOMY    . BREAST BIOPSY  15-20 years ago  . CATARACT  EXTRACTION Bilateral 5 years ago  . HERNIA REPAIR  12 years ago  . TONSILLECTOMY  as child  . TOTAL KNEE ARTHROPLASTY Right 03/23/2013   Procedure: RIGHT TOTAL KNEE ARTHROPLASTY;  Surgeon: Mauri Pole, MD;  Location: [facility];  Service: Orthopedics;  Laterality: Right;     reports that she has never smoked. She has never used smokeless tobacco. She reports current alcohol use. She reports that she does not use drugs.  Allergies  Allergen Reactions  . Codeine Other (See Comments)    unk  . Penicillins Other (See Comments)    unk    History reviewed. No pertinent family history.   Prior to Admission medications   Medication Sig Start Date End Date Taking? Authorizing Provider  albuterol (PROVENTIL HFA;VENTOLIN HFA) 108 (90 BASE) MCG/ACT inhaler Inhale 1 puff into the lungs every 6 (six) hours as needed for wheezing or shortness of breath.   Yes [provider]  carboxymethylcellulose (REFRESH PLUS) 0.5 % SOLN Place 1 drop into both eyes 3 (three) times daily as needed (eye irritation).   Yes [provider]  Javier Docker Oil 300 MG CAPS Take 1 capsule by mouth daily.   Yes [provider]  omeprazole (PRILOSEC OTC) 20 MG tablet Take 20 mg by mouth as needed (heart burn).   Yes [provider]  psyllium (METAMUCIL) 58.6 % powder Take 1 packet by mouth daily.   Yes [provider]  vitamin C (ASCORBIC ACID) 500 MG tablet Take 500 mg by mouth daily.  Yes [provider]  tiZANidine (ZANAFLEX) 4 MG tablet Take 1 tablet (4 mg total) by mouth every 6 (six) hours as needed for muscle spasms. Patient not taking: No sig reported 03/25/13   Lanney Gins, PA-C    Physical Exam: Vitals:   02/23/20 2230 02/23/20 2245 02/23/20 2300 02/23/20 2315  BP: 106/76 (!) 151/67  136/65  Pulse: 81 75 83 81  Resp: (!) 23 14 19 14   Temp:      TempSrc:      SpO2: (!) 89% 100% 94% 98%      Constitutional: Frail, acutely ill no distress Vitals:    02/23/20 2230 02/23/20 2245 02/23/20 2300 02/23/20 2315  BP: 106/76 (!) 151/67  136/65  Pulse: 81 75 83 81  Resp: (!) 23 14 19 14   Temp:      TempSrc:      SpO2: (!) 89% 100% 94% 98%   Eyes: PERRL, lids and conjunctivae normal ENMT: Mucous membranes are moist. Posterior pharynx clear of any exudate or lesions.Normal dentition.  Neck: normal, supple, no masses, no thyromegaly Respiratory: Coarse breath sound bilaterally with mild expiratory wheezing, no crackles. Normal respiratory effort. No accessory muscle use.  Cardiovascular: Regular rate and rhythm, no murmurs / rubs / gallops. No extremity edema. 2+ pedal pulses. No carotid bruits.  Abdomen: no tenderness, no masses palpated. No hepatosplenomegaly. Bowel sounds positive.  Musculoskeletal: no clubbing / cyanosis. Shortening with lateral rotation of the left lower extremity, ecchymosis and tenderness laterally, no contractures. Normal muscle tone.  Skin: no rashes, lesions, ulcers. No induration Neurologic: CN 2-12 grossly intact. Sensation intact, DTR normal. Strength 5/5 in all 4.  Psychiatric:  Alert and oriented x 3. Normal mood.     Labs on Admission: I have personally reviewed following labs and imaging studies  CBC: Recent Labs  Lab 02/23/20 2148  WBC 12.3*  NEUTROABS 9.6*  HGB 14.9  HCT 44.9  MCV 99.6  PLT 223   Basic Metabolic Panel: Recent Labs  Lab 02/23/20 2148  NA 138  K 3.9  CL 103  CO2 24  GLUCOSE 161*  BUN 16  CREATININE 0.67  CALCIUM 8.8*   GFR: CrCl cannot be calculated (Unknown ideal weight.). Liver Function Tests: No results for input(s): AST, ALT, ALKPHOS, BILITOT, PROT, ALBUMIN in the last 168 hours. No results for input(s): LIPASE, AMYLASE in the last 168 hours. No results for input(s): AMMONIA in the last 168 hours. Coagulation Profile: Recent Labs  Lab 02/23/20 2148  INR 1.1   Cardiac Enzymes: No results for input(s): CKTOTAL, CKMB, CKMBINDEX, TROPONINI in the last 168  hours. BNP (last 3 results) No results for input(s): PROBNP in the last 8760 hours. HbA1C: No results for input(s): HGBA1C in the last 72 hours. CBG: No results for input(s): GLUCAP in the last 168 hours. Lipid Profile: No results for input(s): CHOL, HDL, LDLCALC, TRIG, CHOLHDL, LDLDIRECT in the last 72 hours. Thyroid Function Tests: No results for input(s): TSH, T4TOTAL, FREET4, T3FREE, THYROIDAB in the last 72 hours. Anemia Panel: No results for input(s): VITAMINB12, FOLATE, FERRITIN, TIBC, IRON, RETICCTPCT in the last 72 hours. Urine analysis:    Component Value Date/Time   COLORURINE YELLOW 06/30/2018 1420   APPEARANCEUR CLEAR 06/30/2018 1420   LABSPEC 1.018 06/30/2018 1420   PHURINE 6.0 06/30/2018 1420   GLUCOSEU 50 (A) 06/30/2018 1420   HGBUR NEGATIVE 06/30/2018 1420   BILIRUBINUR NEGATIVE 06/30/2018 1420   KETONESUR 20 (A) 06/30/2018 1420   PROTEINUR NEGATIVE 06/30/2018 1420  UROBILINOGEN 0.2 03/16/2013 1412   NITRITE NEGATIVE 06/30/2018 1420   LEUKOCYTESUR NEGATIVE 06/30/2018 1420   Sepsis Labs: @LABRCNTIP (procalcitonin:4,lacticidven:4) )No results found for this or any previous visit (from the past 240 hour(s)).   Radiological Exams on Admission: DG Chest Port 1 View  Result Date: 02/23/2020 CLINICAL DATA:  Preoperative assessment for hip fracture EXAM: PORTABLE CHEST 1 VIEW COMPARISON:  02/01/2013 FINDINGS: Single frontal view of the chest demonstrates an unremarkable cardiac silhouette. No airspace disease, effusion, or pneumothorax. No acute bony abnormalities. IMPRESSION: 1. No acute intrathoracic process. Electronically Signed   By: Randa Ngo M.D.   On: 02/23/2020 22:31   DG Knee Left Port  Result Date: 02/23/2020 CLINICAL DATA:  Status post fall. EXAM: PORTABLE LEFT KNEE - 1-2 VIEW COMPARISON:  None. FINDINGS: No evidence of an acute fracture, dislocation or joint effusion. There is moderate severity narrowing of the medial tibiofemoral compartment space.  Soft tissue structures are unremarkable. IMPRESSION: 1. Degenerative changes without evidence of an acute fracture or dislocation. Electronically Signed   By: Virgina Norfolk M.D.   On: 02/23/2020 23:21   DG Hip Unilat With Pelvis 2-3 Views Left  Result Date: 02/23/2020 CLINICAL DATA:  87 year old female with fall. EXAM: DG HIP (WITH OR WITHOUT PELVIS) 2-3V LEFT COMPARISON:  Pelvic radiograph dated 06/21/2003. FINDINGS: There is a displaced and angulated fracture of the left femoral neck with trochanteric and subtrochanteric extension. There is varus angulation. No dislocation. Mild osteopenia. The soft tissues are unremarkable. IMPRESSION: Displaced and angulated left femoral neck fracture. Electronically Signed   By: Anner Crete M.D.   On: 02/23/2020 22:19    EKG: Independently reviewed. Normal sinus rhythm with nonspecific ST changes  Assessment/Plan Principal Problem:   Closed left femoral fracture (HCC) Active Problems:   GERD (gastroesophageal reflux disease)   Asthma   Rheumatoid arthritis ([place])   Fall at home, initial encounter   Dementia [facility])     #1 displaced left femoral neck fracture: Patient will be admitted for inpatient treatment. We will plan patient for possible surgical repair tomorrow. N.p.o. after midnight. Pain management and supportive care. Will need PT OT thereafter.  #2 mild dementia: At baseline.  #3 GERD: Continue with PPIs  #4 mechanical fall: PT OT after surgery. May require placement in a skilled facility.  #5 rheumatoid arthritis: Confirm and continue home regimen  #6 history of asthma: Mild hypoxia today. Continue breathing treatment. Mild expiratory wheezing   DVT prophylaxis: SCD Code Status: Full code Family Communication: Daughter over the phone Disposition Plan: To be determined Consults called: Dr. Lyla Glassing, orthopedics Admission status: Inpatient  Severity of Illness: The appropriate patient status for this patient is INPATIENT.  Inpatient status is judged to be reasonable and necessary in order to provide the required intensity of service to ensure the patient's safety. The patient's presenting symptoms, physical exam findings, and initial radiographic and laboratory data in the context of their chronic comorbidities is felt to place them at high risk for further clinical deterioration. Furthermore, it is not anticipated that the patient will be medically stable for discharge from the hospital within 2 midnights of admission. The following factors support the patient status of inpatient.   " The patient's presenting symptoms include fall with left hip pain. " The worrisome physical exam findings include ecchymosis shortening and lateral rotation of the lower extremity. " The initial radiographic and laboratory data are worrisome because of left hip fracture. " The chronic co-morbidities include mild dementia.   * I certify  that at the point of admission it is my clinical judgment that the patient will require inpatient hospital care spanning beyond 2 midnights from the point of admission due to high intensity of service, high risk for further deterioration and high frequency of surveillance required.Barbette Merino MD [facility] Pager [phone]  If 7PM-7AM, please contact night-coverage [email or web address] Password [facility]  02/23/2020, 11:51 PM

## 2020-02-24 ENCOUNTER — Inpatient Hospital Stay (HOSPITAL_COMMUNITY): Payer: PPO

## 2020-02-24 ENCOUNTER — Inpatient Hospital Stay (HOSPITAL_COMMUNITY): Payer: PPO | Admitting: Certified Registered Nurse Anesthetist

## 2020-02-24 ENCOUNTER — Encounter (HOSPITAL_COMMUNITY)
Admission: EM | Disposition: A | Discharge status: Skilled Nursing Facility | Payer: Self-pay | Source: Home / Self Care | Attending: Family Medicine

## 2020-02-24 ENCOUNTER — Other Ambulatory Visit: Payer: Self-pay

## 2020-02-24 DIAGNOSIS — S72142A Displaced intertrochanteric fracture of left femur, initial encounter for closed fracture: Principal | ICD-10-CM

## 2020-02-24 HISTORY — PX: INTRAMEDULLARY (IM) NAIL INTERTROCHANTERIC: SHX5875

## 2020-02-24 LAB — COMPREHENSIVE METABOLIC PANEL
ALT: 14 U/L (ref 0–44)
AST: 18 U/L (ref 15–41)
Albumin: 3.5 g/dL (ref 3.5–5.0)
Alkaline Phosphatase: 46 U/L (ref 38–126)
Anion gap: 10 (ref 5–15)
BUN: 16 mg/dL (ref 8–23)
CO2: 24 mmol/L (ref 22–32)
Calcium: 8.7 mg/dL — ABNORMAL LOW (ref 8.9–10.3)
Chloride: 103 mmol/L (ref 98–111)
Creatinine, Ser: 0.59 mg/dL (ref 0.44–1.00)
GFR, Estimated: >60 mL/min (ref >60)
Glucose, Bld: 233 mg/dL — ABNORMAL HIGH (ref 70–99)
Potassium: 4.2 mmol/L (ref 3.5–5.1)
Sodium: 137 mmol/L (ref 135–145)
Total Bilirubin: 0.9 mg/dL (ref 0.3–1.2)
Total Protein: 6.2 g/dL — ABNORMAL LOW (ref 6.5–8.1)

## 2020-02-24 LAB — TYPE AND SCREEN
ABO/RH(D): O POS
Antibody Screen: NEGATIVE

## 2020-02-24 LAB — CBC
HCT: 40 % (ref 36.0–46.0)
Hemoglobin: 13.5 g/dL (ref 12.0–15.0)
MCH: 32.9 pg (ref 26.0–34.0)
MCHC: 33.8 g/dL (ref 30.0–36.0)
MCV: 97.6 fL (ref 80.0–100.0)
Platelets: 190 10*3/uL (ref 150–400)
RBC: 4.1 MIL/uL (ref 3.87–5.11)
RDW: 13.3 % (ref 11.5–15.5)
WBC: 10.8 10*3/uL — ABNORMAL HIGH (ref 4.0–10.5)
nRBC: 0 % (ref 0.0–0.2)

## 2020-02-24 LAB — SURGICAL PCR SCREEN
MRSA, PCR: NEGATIVE
Staphylococcus aureus: NEGATIVE

## 2020-02-24 LAB — SARS CORONAVIRUS 2 (TAT 6-24 HRS): SARS Coronavirus 2: NEGATIVE

## 2020-02-24 SURGERY — FIXATION, FRACTURE, INTERTROCHANTERIC, WITH INTRAMEDULLARY ROD
Anesthesia: Monitor Anesthesia Care | Laterality: Left

## 2020-02-24 MED ORDER — PSYLLIUM 95 % PO PACK
1.0000 | PACK | Freq: Every day | ORAL | Status: DC
  Administered 2020-02-25 – 2020-02-26 (×2): 1 via ORAL
  Filled 2020-02-24 (×2): qty 1

## 2020-02-24 MED ORDER — ACETAMINOPHEN 325 MG PO TABS: 650.0000 mg | ORAL_TABLET | Freq: Four times a day (QID) | ORAL | Status: DC | PRN

## 2020-02-24 MED ORDER — POVIDONE-IODINE 10 % EX SWAB
2.0000 "application " | Freq: Once | CUTANEOUS | Status: AC
  Administered 2020-02-24: 2 via TOPICAL

## 2020-02-24 MED ORDER — ISOPROPYL ALCOHOL 70 % SOLN
Status: DC | PRN
  Administered 2020-02-24: 1 via TOPICAL

## 2020-02-24 MED ORDER — EPHEDRINE 5 MG/ML INJ
INTRAVENOUS | Status: AC
  Filled 2020-02-24: qty 10

## 2020-02-24 MED ORDER — PHENYLEPHRINE HCL-NACL 10-0.9 MG/250ML-% IV SOLN
INTRAVENOUS | Status: DC | PRN
  Administered 2020-02-24: 30 ug/min via INTRAVENOUS

## 2020-02-24 MED ORDER — MORPHINE SULFATE (PF) 2 MG/ML IV SOLN
0.5000 mg | INTRAVENOUS | Status: DC | PRN
  Administered 2020-02-24 – 2020-02-26 (×5): 1 mg via INTRAVENOUS
  Filled 2020-02-24 (×5): qty 1

## 2020-02-24 MED ORDER — HYDROCODONE-ACETAMINOPHEN 5-325 MG PO TABS: 1.0000 | ORAL_TABLET | ORAL | Status: DC | PRN

## 2020-02-24 MED ORDER — PROPOFOL 500 MG/50ML IV EMUL
INTRAVENOUS | Status: DC | PRN
  Administered 2020-02-24: 30 ug/kg/min via INTRAVENOUS

## 2020-02-24 MED ORDER — TRANEXAMIC ACID-NACL 1000-0.7 MG/100ML-% IV SOLN
1000.0000 mg | INTRAVENOUS | Status: AC
  Administered 2020-02-24: 1000 mg via INTRAVENOUS
  Filled 2020-02-24 (×2): qty 100

## 2020-02-24 MED ORDER — ONDANSETRON HCL 4 MG/2ML IJ SOLN: 4.0000 mg | Freq: Four times a day (QID) | INTRAMUSCULAR | Status: DC | PRN

## 2020-02-24 MED ORDER — DEXTROSE-NACL 5-0.45 % IV SOLN: INTRAVENOUS | Status: DC

## 2020-02-24 MED ORDER — CARBOXYMETHYLCELLULOSE SODIUM 0.5 % OP SOLN: 1.0000 [drp] | Freq: Three times a day (TID) | OPHTHALMIC | Status: DC | PRN

## 2020-02-24 MED ORDER — FENTANYL CITRATE (PF) 100 MCG/2ML IJ SOLN: 25.0000 ug | INTRAMUSCULAR | Status: DC | PRN

## 2020-02-24 MED ORDER — PHENOL 1.4 % MT LIQD
1.0000 | OROMUCOSAL | Status: DC | PRN
  Filled 2020-02-24: qty 177

## 2020-02-24 MED ORDER — HYDROCODONE-ACETAMINOPHEN 7.5-325 MG PO TABS
1.0000 | ORAL_TABLET | ORAL | Status: DC | PRN
  Administered 2020-02-24 – 2020-02-26 (×7): 2 via ORAL
  Filled 2020-02-24 (×8): qty 2

## 2020-02-24 MED ORDER — ACETAMINOPHEN 325 MG PO TABS
325.0000 mg | ORAL_TABLET | Freq: Four times a day (QID) | ORAL | Status: DC | PRN
  Administered 2020-02-25 – 2020-02-26 (×3): 650 mg via ORAL
  Filled 2020-02-24 (×3): qty 2

## 2020-02-24 MED ORDER — FENTANYL CITRATE (PF) 250 MCG/5ML IJ SOLN
INTRAMUSCULAR | Status: DC | PRN
  Administered 2020-02-24 (×2): 25 ug via INTRAVENOUS

## 2020-02-24 MED ORDER — ASCORBIC ACID 500 MG PO TABS
500.0000 mg | ORAL_TABLET | Freq: Every day | ORAL | Status: DC
  Administered 2020-02-25 – 2020-02-26 (×2): 500 mg via ORAL
  Filled 2020-02-24 (×2): qty 1

## 2020-02-24 MED ORDER — ALBUTEROL SULFATE HFA 108 (90 BASE) MCG/ACT IN AERS
1.0000 | INHALATION_SPRAY | Freq: Four times a day (QID) | RESPIRATORY_TRACT | Status: DC | PRN
  Filled 2020-02-24: qty 6.7

## 2020-02-24 MED ORDER — PHENYLEPHRINE HCL (PRESSORS) 10 MG/ML IV SOLN
INTRAVENOUS | Status: AC
  Filled 2020-02-24: qty 9

## 2020-02-24 MED ORDER — CHLORHEXIDINE GLUCONATE 4 % EX LIQD
60.0000 mL | Freq: Once | CUTANEOUS | Status: AC
  Administered 2020-02-24: 4 via TOPICAL
  Filled 2020-02-24: qty 60

## 2020-02-24 MED ORDER — FENTANYL CITRATE (PF) 100 MCG/2ML IJ SOLN
INTRAMUSCULAR | Status: AC
  Filled 2020-02-24: qty 2

## 2020-02-24 MED ORDER — CEFAZOLIN SODIUM-DEXTROSE 2-4 GM/100ML-% IV SOLN
2.0000 g | INTRAVENOUS | Status: AC
  Administered 2020-02-24: 2 g via INTRAVENOUS
  Filled 2020-02-24 (×2): qty 100

## 2020-02-24 MED ORDER — BUPIVACAINE IN DEXTROSE 0.75-8.25 % IT SOLN
INTRATHECAL | Status: DC | PRN
  Administered 2020-02-24: 1.6 mL via INTRATHECAL

## 2020-02-24 MED ORDER — MENTHOL 3 MG MT LOZG: 1.0000 | LOZENGE | OROMUCOSAL | Status: DC | PRN

## 2020-02-24 MED ORDER — CEFAZOLIN SODIUM-DEXTROSE 2-4 GM/100ML-% IV SOLN
2.0000 g | Freq: Four times a day (QID) | INTRAVENOUS | Status: AC
  Administered 2020-02-24 – 2020-02-25 (×2): 2 g via INTRAVENOUS
  Filled 2020-02-24 (×2): qty 100

## 2020-02-24 MED ORDER — LACTATED RINGERS IV SOLN: INTRAVENOUS | Status: DC | PRN

## 2020-02-24 MED ORDER — MUPIROCIN 2 % EX OINT
1.0000 "application " | TOPICAL_OINTMENT | Freq: Two times a day (BID) | CUTANEOUS | Status: DC
  Administered 2020-02-24: 1 via NASAL
  Filled 2020-02-24: qty 22

## 2020-02-24 MED ORDER — KETOROLAC TROMETHAMINE 15 MG/ML IJ SOLN
15.0000 mg | Freq: Four times a day (QID) | INTRAMUSCULAR | Status: DC | PRN
  Administered 2020-02-24: 15 mg via INTRAVENOUS
  Filled 2020-02-24: qty 1

## 2020-02-24 MED ORDER — DOCUSATE SODIUM 100 MG PO CAPS
100.0000 mg | ORAL_CAPSULE | Freq: Two times a day (BID) | ORAL | Status: DC
  Administered 2020-02-24 – 2020-02-26 (×4): 100 mg via ORAL
  Filled 2020-02-24 (×4): qty 1

## 2020-02-24 MED ORDER — CHLORHEXIDINE GLUCONATE CLOTH 2 % EX PADS
6.0000 | MEDICATED_PAD | Freq: Every day | CUTANEOUS | Status: DC
  Administered 2020-02-24: 6 via TOPICAL

## 2020-02-24 MED ORDER — KRILL OIL 300 MG PO CAPS: 1.0000 | ORAL_CAPSULE | Freq: Every day | ORAL | Status: DC

## 2020-02-24 MED ORDER — ONDANSETRON HCL 4 MG PO TABS: 4.0000 mg | ORAL_TABLET | Freq: Four times a day (QID) | ORAL | Status: DC | PRN

## 2020-02-24 MED ORDER — METOCLOPRAMIDE HCL 5 MG PO TABS: 5.0000 mg | ORAL_TABLET | Freq: Three times a day (TID) | ORAL | Status: DC | PRN

## 2020-02-24 MED ORDER — ONDANSETRON HCL 4 MG/2ML IJ SOLN
INTRAMUSCULAR | Status: DC | PRN
  Administered 2020-02-24: 4 mg via INTRAVENOUS

## 2020-02-24 MED ORDER — METOCLOPRAMIDE HCL 5 MG/ML IJ SOLN: 5.0000 mg | Freq: Three times a day (TID) | INTRAMUSCULAR | Status: DC | PRN

## 2020-02-24 MED ORDER — ENOXAPARIN SODIUM 40 MG/0.4ML ~~LOC~~ SOLN
40.0000 mg | SUBCUTANEOUS | Status: DC
  Administered 2020-02-25 – 2020-02-26 (×2): 40 mg via SUBCUTANEOUS
  Filled 2020-02-24 (×2): qty 0.4

## 2020-02-24 MED ORDER — 0.9 % SODIUM CHLORIDE (POUR BTL) OPTIME
TOPICAL | Status: DC | PRN
  Administered 2020-02-24: 1000 mL

## 2020-02-24 MED ORDER — OMEPRAZOLE 20 MG PO CPDR
20.0000 mg | DELAYED_RELEASE_CAPSULE | Freq: Every day | ORAL | Status: DC | PRN
  Filled 2020-02-24: qty 1

## 2020-02-24 MED ORDER — POLYVINYL ALCOHOL 1.4 % OP SOLN
1.0000 [drp] | Freq: Three times a day (TID) | OPHTHALMIC | Status: DC | PRN
  Filled 2020-02-24: qty 15

## 2020-02-24 MED ORDER — OMEPRAZOLE MAGNESIUM 20 MG PO TBEC: 20.0000 mg | DELAYED_RELEASE_TABLET | ORAL | Status: DC | PRN

## 2020-02-24 MED ORDER — ENSURE PRE-SURGERY PO LIQD
296.0000 mL | Freq: Once | ORAL | Status: DC
  Filled 2020-02-24: qty 296

## 2020-02-24 MED ORDER — LIP MEDEX EX OINT
1.0000 "application " | TOPICAL_OINTMENT | CUTANEOUS | Status: DC | PRN
  Filled 2020-02-24: qty 7

## 2020-02-24 MED ORDER — ONDANSETRON HCL 4 MG/2ML IJ SOLN
INTRAMUSCULAR | Status: AC
  Filled 2020-02-24: qty 4

## 2020-02-24 MED ORDER — ACETAMINOPHEN 650 MG RE SUPP: 650.0000 mg | Freq: Four times a day (QID) | RECTAL | Status: DC | PRN

## 2020-02-24 SURGICAL SUPPLY — 46 items
BAG ZIPLOCK 12X15 (MISCELLANEOUS) IMPLANT
BIT DRILL 4.3MMS DISTAL GRDTED (BIT) ×1 IMPLANT
BIT DRILL LAG SCREW (DRILL) ×1 IMPLANT
CHLORAPREP W/TINT 26 (MISCELLANEOUS) ×2 IMPLANT
COVER PERINEAL POST (MISCELLANEOUS) ×2 IMPLANT
COVER SURGICAL LIGHT HANDLE (MISCELLANEOUS) ×2 IMPLANT
COVER WAND RF STERILE (DRAPES) IMPLANT
DERMABOND ADVANCED (GAUZE/BANDAGES/DRESSINGS) ×2
DERMABOND ADVANCED .7 DNX12 (GAUZE/BANDAGES/DRESSINGS) ×2 IMPLANT
DRAPE C-ARM 42X120 X-RAY (DRAPES) ×2 IMPLANT
DRAPE C-ARMOR (DRAPES) ×2 IMPLANT
DRAPE IMP U-DRAPE 54X76 (DRAPES) ×4 IMPLANT
DRAPE SHEET LG 3/4 BI-LAMINATE (DRAPES) ×4 IMPLANT
DRAPE STERI IOBAN 125X83 (DRAPES) ×2 IMPLANT
DRAPE U-SHAPE 47X51 STRL (DRAPES) ×4 IMPLANT
DRILL 4.3MMS DISTAL GRADUATED (BIT) ×2
DRILL LAG SCREW (DRILL) ×2
DRSG AQUACEL AG ADV 3.5X 4 (GAUZE/BANDAGES/DRESSINGS) ×6 IMPLANT
DRSG MEPILEX BORDER 4X4 (GAUZE/BANDAGES/DRESSINGS) ×4 IMPLANT
DRSG MEPILEX BORDER 4X8 (GAUZE/BANDAGES/DRESSINGS) IMPLANT
ELECT BLADE TIP CTD 4 INCH (ELECTRODE) IMPLANT
FACESHIELD WRAPAROUND (MASK) ×4 IMPLANT
GAUZE SPONGE 4X4 12PLY STRL (GAUZE/BANDAGES/DRESSINGS) ×2 IMPLANT
GLOVE BIO SURGEON STRL SZ8.5 (GLOVE) ×4 IMPLANT
GLOVE BIOGEL PI IND STRL 8.5 (GLOVE) ×1 IMPLANT
GLOVE BIOGEL PI INDICATOR 8.5 (GLOVE) ×1
GLOVE SRG 8 PF TXTR STRL LF DI (GLOVE) ×1 IMPLANT
GLOVE SURG ENC TEXT LTX SZ7.5 (GLOVE) ×4 IMPLANT
GLOVE SURG UNDER POLY LF SZ8 (GLOVE) ×2
GOWN SPEC L3 XXLG W/TWL (GOWN DISPOSABLE) ×2 IMPLANT
GUIDEPIN 3.2X17.5 THRD DISP (PIN) ×2 IMPLANT
GUIDEWIRE BALL NOSE 80CM (WIRE) ×2 IMPLANT
HFN LAG SCREW 10.5MM X 85MM (Screw) ×2 IMPLANT
KIT BASIN OR (CUSTOM PROCEDURE TRAY) ×2 IMPLANT
KIT TURNOVER KIT A (KITS) IMPLANT
MANIFOLD NEPTUNE II (INSTRUMENTS) ×2 IMPLANT
MARKER SKIN DUAL TIP RULER LAB (MISCELLANEOUS) ×2 IMPLANT
NAIL AFFIXUS 11X340MM 125D LT (Nail) ×2 IMPLANT
NAIL IM TROCH LNG 11X340 125D (Nail) ×1 IMPLANT
PACK GENERAL/GYN (CUSTOM PROCEDURE TRAY) ×2 IMPLANT
PENCIL SMOKE EVACUATOR (MISCELLANEOUS) IMPLANT
SCREW BONE CORTICAL 5.0X36 (Screw) ×2 IMPLANT
SUT MNCRL AB 3-0 PS2 18 (SUTURE) ×2 IMPLANT
SUT MON AB 2-0 CT1 36 (SUTURE) ×2 IMPLANT
SUT VIC AB 1 CT1 36 (SUTURE) ×2 IMPLANT
TOWEL OR 17X26 10 PK STRL BLUE (TOWEL DISPOSABLE) ×2 IMPLANT

## 2020-02-24 NOTE — Anesthesia Postprocedure Evaluation (Signed)
Anesthesia Post Note  Patient: DENAYA HORN  Procedure(s) Performed: INTRAMEDULLARY (IM) NAIL INTERTROCHANTRIC (Left )     Patient location during evaluation: PACU Anesthesia Type: MAC and Spinal Level of consciousness: oriented and awake and alert Pain management: pain level controlled Vital Signs Assessment: post-procedure vital signs reviewed and stable Respiratory status: spontaneous breathing, respiratory function stable and patient connected to nasal cannula oxygen Cardiovascular status: blood pressure returned to baseline and stable Postop Assessment: no headache, no backache, no apparent nausea or vomiting, spinal receding and patient able to bend at knees Anesthetic complications: no   No complications documented.  Last Vitals:  Vitals:   02/24/20 1930 02/24/20 1945  BP: 125/61 123/64  Pulse: 67 70  Resp: 12 14  Temp:    SpO2: 99% 99%    Last Pain:  Vitals:   02/24/20 1945  TempSrc:   PainSc: 0-No pain                 Gabbrielle Mcnicholas,W. EDMOND

## 2020-02-24 NOTE — Progress Notes (Signed)
[facility]  PROGRESS NOTE  Cathy Martinez:[id number] DOB: Jun 21, 1931 DOA: 02/23/2020 PCP: Aida Puffer, MD   Brief HPI:   87 year old female with history of mild dementia, GERD, mild COPD, remote arthritis, sciatica, asthma, diverticular disease who lives alone apparently sustained a mechanical fall yesterday when she slipped and fell.  She complained of pain in the left side of hip.  Work-up showed displaced femoral neck fracture on the left.  Orthopedics was consulted.  COVID-19 screen was negative.    Subjective   Patient seen and examined, denies any complaints.  Plan for surgery today.   Assessment/Plan:     1. Displaced left femoral neck fracture-orthopedic surgery has been consulted.  Plan for surgery today. 2. Dementia-at baseline 3. GERD-continue PPI. 4. Mechanical fall-PT/OT after surgery, may require placement in skilled facility. 5. History of asthma-continue as needed albuterol     COVID-19 Labs  No results for input(s): DDIMER, FERRITIN, LDH, CRP in the last 72 hours.  Lab Results  Component Value Date   SARSCOV2NAA NEGATIVE 02/23/2020     Scheduled medications:   . [MAR Hold] vitamin C  500 mg Oral Daily  . [MAR Hold] Chlorhexidine Gluconate Cloth  6 each Topical Daily  . feeding supplement  296 mL Oral Once  . [MAR Hold] mupirocin ointment  1 application Nasal BID  . [MAR Hold] psyllium  1 packet Oral Daily         SpO2: 90 %    CBC: Recent Labs  Lab 02/23/20 2148 02/24/20 0500  WBC 12.3* 10.8*  NEUTROABS 9.6*  --   HGB 14.9 13.5  HCT 44.9 40.0  MCV 99.6 97.6  PLT 223 190    Basic Metabolic Panel: Recent Labs  Lab 02/23/20 2148 02/24/20 0500  NA 138 137  K 3.9 4.2  CL 103 103  CO2 24 24  GLUCOSE 161* 233*  BUN 16 16  CREATININE 0.67 0.59  CALCIUM 8.8* 8.7*     Liver Function Tests: Recent Labs  Lab 02/24/20 0500  AST 18  ALT 14  ALKPHOS 46  BILITOT 0.9  PROT 6.2*  ALBUMIN 3.5      Antibiotics: Anti-infectives (From admission, onward)   Start     Dose/Rate Route Frequency Ordered Stop   02/24/20 1515  ceFAZolin (ANCEF) IVPB 2g/100 mL premix        2 g 200 mL/hr over 30 Minutes Intravenous On call to O.R. 02/24/20 1049 02/25/20 0559       DVT prophylaxis: SCDs  Code Status: Full code  Family Communication: No family at bedside   Consultants:  Orthopedics  Procedures:      Objective   Vitals:   02/24/20 0541 02/24/20 0542 02/24/20 1028 02/24/20 1328  BP:   (!) 120/55 124/67  Pulse:   62 65  Resp:   20 20  Temp:   98.6 F (37 C) 97.7 F (36.5 C)  TempSrc:   Oral Oral  SpO2:   96% 90%  Weight:  45.5 kg    Height: 5' (1.524 m)       Intake/Output Summary (Last 24 hours) at 02/24/2020 1524 Last data filed at 02/24/2020 1118 Gross per 24 hour  Intake 686.25 ml  Output --  Net 686.25 ml    No intake/output data recorded.  Filed Weights   02/24/20 0542  Weight: 45.5 kg    Physical Examination:   General-appears in no acute distress Heart-S1-S2, regular, no murmur auscultated Lungs-clear to auscultation bilaterally, no wheezing  or crackles auscultated Abdomen-soft, nontender, no organomegaly Extremities-no edema in the lower extremities Neuro-alert, oriented x3, no focal deficit noted  Status is: Inpatient  Dispo: The patient is from: Home              Anticipated d/c is to: Skilled nursing facility              Anticipated d/c date is: 02/27/2020              Patient currently not stable for discharge  Barrier to discharge-plan for surgery today.       Data Reviewed:   Recent Results (from the past 240 hour(s))  SARS CORONAVIRUS 2 (TAT 6-24 HRS) Nasopharyngeal Nasopharyngeal Swab     Status: None   Collection Time: 02/23/20 10:05 PM   Specimen: Nasopharyngeal Swab  Result Value Ref Range Status   SARS Coronavirus 2 NEGATIVE NEGATIVE Final    Comment: (NOTE) SARS-CoV-2 target nucleic acids are NOT  DETECTED.  The SARS-CoV-2 RNA is generally detectable in upper and lower respiratory specimens during the acute phase of infection. Negative results do not preclude SARS-CoV-2 infection, do not rule out co-infections with other pathogens, and should not be used as the sole basis for treatment or other patient management decisions. Negative results must be combined with clinical observations, patient history, and epidemiological information. The expected result is Negative.  Fact Sheet for Patients: SugarRoll.be  Fact Sheet for Healthcare Providers: https://www.woods-mathews.com/  This test is not yet approved or cleared by the [place] FDA and  has been authorized for detection and/or diagnosis of SARS-CoV-2 by FDA under an Emergency Use Authorization (EUA). This EUA will remain  in effect (meaning this test can be used) for the duration of the COVID-19 declaration under Se ction 564(b)(1) of the Act, 21 U.S.C. section 360bbb-3(b)(1), unless the authorization is terminated or revoked sooner.  Performed at [facility], [place] [address] [place], [place] [address]   Surgical PCR screen     Status: None   Collection Time: 02/24/20  5:59 AM   Specimen: Nasal Mucosa; Nasal Swab  Result Value Ref Range Status   MRSA, PCR NEGATIVE NEGATIVE Final   Staphylococcus aureus NEGATIVE NEGATIVE Final    Comment: (NOTE) The Xpert SA Assay (FDA approved for NASAL specimens in patients 25 years of age and older), is one component of a comprehensive surveillance program. It is not intended to diagnose infection nor to guide or monitor treatment. Performed at [facility], [place] [address], [place], [place] [address]     No results for input(s): LIPASE, AMYLASE in the last 168 hours. No results for input(s): AMMONIA in the last 168 hours.  Cardiac Enzymes: No results for input(s): CKTOTAL, CKMB, CKMBINDEX, TROPONINI in the  last 168 hours. BNP (last 3 results) No results for input(s): BNP in the last 8760 hours.  ProBNP (last 3 results) No results for input(s): PROBNP in the last 8760 hours.  Studies:  DG Chest Port 1 View  Result Date: 02/23/2020 CLINICAL DATA:  Preoperative assessment for hip fracture EXAM: PORTABLE CHEST 1 VIEW COMPARISON:  02/01/2013 FINDINGS: Single frontal view of the chest demonstrates an unremarkable cardiac silhouette. No airspace disease, effusion, or pneumothorax. No acute bony abnormalities. IMPRESSION: 1. No acute intrathoracic process. Electronically Signed   By: Randa Ngo M.D.   On: 02/23/2020 22:31   DG Knee Left Port  Result Date: 02/23/2020 CLINICAL DATA:  Status post fall. EXAM: PORTABLE LEFT KNEE - 1-2 VIEW  COMPARISON:  None. FINDINGS: No evidence of an acute fracture, dislocation or joint effusion. There is moderate severity narrowing of the medial tibiofemoral compartment space. Soft tissue structures are unremarkable. IMPRESSION: 1. Degenerative changes without evidence of an acute fracture or dislocation. Electronically Signed   By: Virgina Norfolk M.D.   On: 02/23/2020 23:21   DG Hip Unilat With Pelvis 2-3 Views Left  Result Date: 02/23/2020 CLINICAL DATA:  87 year old female with fall. EXAM: DG HIP (WITH OR WITHOUT PELVIS) 2-3V LEFT COMPARISON:  Pelvic radiograph dated 06/21/2003. FINDINGS: There is a displaced and angulated fracture of the left femoral neck with trochanteric and subtrochanteric extension. There is varus angulation. No dislocation. Mild osteopenia. The soft tissues are unremarkable. IMPRESSION: Displaced and angulated left femoral neck fracture. Electronically Signed   By: Anner Crete M.D.   On: 02/23/2020 22:19       Keys   [facility] If 7PM-7AM, please contact night-coverage at [email or web address], Office  [phone]   02/24/2020, 3:24 PM  LOS: 1 day

## 2020-02-24 NOTE — Op Note (Signed)
OPERATIVE REPORT  SURGEON: Rod Can, MD   ASSISTANT: Cherlynn June, PA-C.  PREOPERATIVE DIAGNOSIS: Comminuted Left reverse obliquity intertrochanteric femur fracture.   POSTOPERATIVE DIAGNOSIS:  Comminuted Left reverse obliquity intertrochanteric femur fracture.   PROCEDURE: Intramedullary fixation, Left femur.   IMPLANTS: Biomet Affixus Hip Fracture Nail, 11 by 340 mm, 125 degrees. 10.5 x 85 mm Hip Fracture Nail Lag Screw. 5 x 36 mm distal interlocking screw 1.  ANESTHESIA:  MAC and Spinal  ESTIMATED BLOOD LOSS:-100 mL    ANTIBIOTICS: 2 g Ancef.  DRAINS: None.  COMPLICATIONS: None.   CONDITION: PACU - hemodynamically stable.Marland Kitchen   BRIEF CLINICAL NOTE: Cathy Martinez is a 87 y.o. female who presented with an intertrochanteric femur fracture. The patient was admitted to the hospitalist service and underwent perioperative risk stratification and medical optimization. The risks, benefits, and alternatives to the procedure were explained, and the patient elected to proceed.  PROCEDURE IN DETAIL: Surgical site was marked by myself. The patient was taken to the operating room and anesthesia was induced on the bed. The patient was then transferred to the  [facility] table and the nonoperative lower extremity was scissored underneath the operative side. The fracture was reduced with traction, internal rotation, and adduction. The hip was prepped and draped in the normal sterile surgical fashion. Timeout was called verifying side and site of surgery. Preop antibiotics were given with 60 minutes of beginning the procedure.  Fluoroscopy was used to define the patient's anatomy. A 4 cm incision was made just proximal to the tip of the greater trochanter. The awl was used to obtain the standard starting point for a trochanteric entry nail under fluoroscopic control. The guidepin was placed. The entry reamer was used to open the proximal femur.  I placed the guidewire to the level of the physeal  scar of the knee. I measured the length of the guidewire. Sequential reaming was performed up to a size 12.5 mm with excellent chatter. Therefore, a size 11 by 340 mm nail was selected and assembled to the jig on the back table. The nail was placed without any difficulty. Through a separate stab incision, the cannula was placed down to the bone in preparation for the cephalomedullary device. A guidepin was placed into the femoral head using AP and lateral fluoroscopy views. The pin was measured, and then reaming was performed to the appropriate depth. The lag screw was inserted to the appropriate depth. The fracture was compressed through the jig. The setscrew was tightened and then loosened one quarter turn. Using perfect circle technique, a distal interlocking screw was placed. The jig was removed. Final AP and lateral fluoroscopy views were obtained to confirm fracture reduction and hardware placement. Tip apex distance was appropriate. There was no chondral penetration.  The wounds were copiously irrigated with saline. The wound was closed in layers with #1 Vicryl for the fascia, 2-0 Monocryl for the deep dermal layer, and 3-0 Monocryl subcuticular stitch. Glue was applied to the skin. Once the glue was fully hardened, sterile dressing was applied. The patient was then awakened from anesthesia and taken to the PACU in stable condition. Sponge needle and instrument counts were correct at the end of the case 2. There were no known complications.  We will readmit the patient to the hospitalist. Weightbearing status will be 50% weightbearing with a walker. We will begin Lovenox for DVT prophylaxis. The patient will work with physical therapy and undergo disposition planning.  Please note that a surgical assistant was a medical  necessity for this procedure to perform it in a safe and expeditious manner. Assistant was necessary to provide appropriate retraction of vital neurovascular structures, to prevent  femoral fracture, and to allow for anatomic placement of the prosthesis.

## 2020-02-24 NOTE — Progress Notes (Signed)
PHARMACIST - PHYSICIAN ORDER COMMUNICATION  CONCERNING: P&T Medication Policy on Herbal Medications  DESCRIPTION:  This patient's order for:  [employer]  has been noted.  This product(s) is classified as an "herbal" or natural product. Due to a lack of definitive safety studies or FDA approval, nonstandard manufacturing practices, plus the potential risk of unknown drug-drug interactions while on inpatient medications, the Pharmacy and Therapeutics Committee does not permit the use of "herbal" or natural products of this type within [facility].   ACTION TAKEN: The pharmacy department is unable to verify this order at this time and your patient has been informed of this safety policy. Please reevaluate patient's clinical condition at discharge and address if the herbal or natural product(s) should be resumed at that time.  Terrilee Files, PharmD

## 2020-02-24 NOTE — Progress Notes (Addendum)
Pt to be transferred to 1311 post op. Pt belongings taken to 1311 and report called to next shift Morrie Sheldon, Charity fundraiser. Pt's son, [name], made aware of pt transfer as well.

## 2020-02-24 NOTE — Anesthesia Procedure Notes (Signed)
Spinal  Patient location during procedure: OR Start time: 02/24/2020 5:00 PM End time: 02/24/2020 5:06 PM Staffing Performed: resident/CRNA  Resident/CRNA: Milford Cage, CRNA Preanesthetic Checklist Completed: patient identified, IV checked, risks and benefits discussed, surgical consent, monitors and equipment checked, pre-op evaluation and timeout performed Spinal Block Patient position: left lateral decubitus Prep: DuraPrep Patient monitoring: heart rate, cardiac monitor, continuous pulse ox and blood pressure Approach: midline Location: L3-4 Injection technique: single-shot Needle Needle type: Sprotte  Needle gauge: 24 G Needle length: 9 cm Assessment Sensory level: T8 Additional Notes IV functioning, monitors applied to pt. Expiration date of kit checked and confirmed to be in date. Sterile prep and drape, hand hygiene and sterile gloved used. Pt was positioned and spine was prepped in sterile fashion. Skin was anesthetized with lidocaine. Free flow of clear CSF obtained prior to injecting local anesthetic into CSF x 2 attempt. Spinal needle aspirated freely following injection. Needle was carefully withdrawn, and pt tolerated procedure well. Loss of motor and sensory on exam post injection.

## 2020-02-24 NOTE — Consult Note (Signed)
ORTHOPAEDIC CONSULTATION  REQUESTING PHYSICIAN: Sharl Ma, Sarina Ill, MD  PCP:  Aida Puffer, MD  Chief Complaint: Left hip injury  HPI: Cathy Martinez is a 87 y.o. female with a past medical history of mild dementia, GERD, COPD, rheumatoid arthritis, sciatica, asthma, and diverticulosis who had a mechanical fall at home, and landed on her left hip.  She had left hip pain and inability to weight-bear.  She was brought to the emergency department, where x-rays revealed a displaced, comminuted left reverse obliquity intertrochanteric femur fracture.  She denies other injuries.  She lives alone.  She was admitted by the hospitalist service for perioperative risk stratification and medical optimization.  Orthopedic consultation was placed for management of her left hip fracture.  She is known to emerge orthopedics due to previous total knee arthroplasty by Dr. Charlann Boxer.  Past Medical History:  Diagnosis Date  . Asthma    very rare  . Diverticulosis   . GERD (gastroesophageal reflux disease)    occasional  . H/O bronchitis   . H/O small bowel obstruction 2014  . Pneumonia    hx of 3 years ago  . PONV (postoperative nausea and vomiting)   . Rheumatoid arthritis (HCC)   . Sciatica    Past Surgical History:  Procedure Laterality Date  . ABDOMINAL HYSTERECTOMY    . BREAST BIOPSY  15-20 years ago  . CATARACT EXTRACTION Bilateral 5 years ago  . HERNIA REPAIR  12 years ago  . TONSILLECTOMY  as child  . TOTAL KNEE ARTHROPLASTY Right 03/23/2013   Procedure: RIGHT TOTAL KNEE ARTHROPLASTY;  Surgeon: Shelda Pal, MD;  Location: [facility];  Service: Orthopedics;  Laterality: Right;   Social History   Socioeconomic History  . Marital status: Widowed    Spouse name: Not on file  . Number of children: Not on file  . Years of education: Not on file  . Highest education level: Not on file  Occupational History  . Not on file  Tobacco Use  . Smoking status: Never Smoker  . Smokeless tobacco: Never  Used  Substance and Sexual Activity  . Alcohol use: Yes    Comment: rare  . Drug use: No  . Sexual activity: Never  Other Topics Concern  . Not on file  Social History Narrative  . Not on file   Social Determinants of Health   Financial Resource Strain: Not on file  Food Insecurity: Not on file  Transportation Needs: Not on file  Physical Activity: Not on file  Stress: Not on file  Social Connections: Not on file   History reviewed. No pertinent family history. Allergies  Allergen Reactions  . Codeine Other (See Comments)    unk  . Penicillins Other (See Comments)    unk   Prior to Admission medications   Medication Sig Start Date End Date Taking? Authorizing Provider  albuterol (PROVENTIL HFA;VENTOLIN HFA) 108 (90 BASE) MCG/ACT inhaler Inhale 1 puff into the lungs every 6 (six) hours as needed for wheezing or shortness of breath.   Yes [provider]  carboxymethylcellulose (REFRESH PLUS) 0.5 % SOLN Place 1 drop into both eyes 3 (three) times daily as needed (eye irritation).   Yes [provider]  Boris Lown Oil 300 MG CAPS Take 1 capsule by mouth daily.   Yes [provider]  omeprazole (PRILOSEC OTC) 20 MG tablet Take 20 mg by mouth as needed (heart burn).   Yes [provider]  psyllium (METAMUCIL) 58.6 % powder  Take 1 packet by mouth daily.   Yes [provider]  vitamin C (ASCORBIC ACID) 500 MG tablet Take 500 mg by mouth daily.   Yes [provider]  tiZANidine (ZANAFLEX) 4 MG tablet Take 1 tablet (4 mg total) by mouth every 6 (six) hours as needed for muscle spasms. Patient not taking: No sig reported 03/25/13   Danae Orleans, PA-C   DG Chest Port 1 View  Result Date: 02/23/2020 CLINICAL DATA:  Preoperative assessment for hip fracture EXAM: PORTABLE CHEST 1 VIEW COMPARISON:  02/01/2013 FINDINGS: Single frontal view of the chest demonstrates an unremarkable cardiac silhouette. No airspace disease, effusion, or  pneumothorax. No acute bony abnormalities. IMPRESSION: 1. No acute intrathoracic process. Electronically Signed   By: Randa Ngo M.D.   On: 02/23/2020 22:31   DG Knee Left Port  Result Date: 02/23/2020 CLINICAL DATA:  Status post fall. EXAM: PORTABLE LEFT KNEE - 1-2 VIEW COMPARISON:  None. FINDINGS: No evidence of an acute fracture, dislocation or joint effusion. There is moderate severity narrowing of the medial tibiofemoral compartment space. Soft tissue structures are unremarkable. IMPRESSION: 1. Degenerative changes without evidence of an acute fracture or dislocation. Electronically Signed   By: Virgina Norfolk M.D.   On: 02/23/2020 23:21   DG Hip Unilat With Pelvis 2-3 Views Left  Result Date: 02/23/2020 CLINICAL DATA:  87 year old female with fall. EXAM: DG HIP (WITH OR WITHOUT PELVIS) 2-3V LEFT COMPARISON:  Pelvic radiograph dated 06/21/2003. FINDINGS: There is a displaced and angulated fracture of the left femoral neck with trochanteric and subtrochanteric extension. There is varus angulation. No dislocation. Mild osteopenia. The soft tissues are unremarkable. IMPRESSION: Displaced and angulated left femoral neck fracture. Electronically Signed   By: Anner Crete M.D.   On: 02/23/2020 22:19    Positive ROS: All other systems have been reviewed and were otherwise negative with the exception of those mentioned in the HPI and as above.  Physical Exam: General: Alert, no acute distress Cardiovascular: No pedal edema Respiratory: No cyanosis, no use of accessory musculature GI: No organomegaly, abdomen is soft and non-tender Skin: No lesions in the area of chief complaint Neurologic: Sensation intact distally Psychiatric: Patient is competent for consent with normal mood and affect Lymphatic: No axillary or cervical lymphadenopathy  MUSCULOSKELETAL: Examination of the left hip reveals no skin wounds or lesions.  She is shortened and rotated.  Pain with attempted logrolling the hip.   Positive motor function dorsiflexion, plantarflexion, great toe extension.  She reports intact sensation.  Palpable pedal pulses.  Assessment: Closed comminuted left reverse obliquity intertrochanteric femur fracture  Plan: I discussed the findings with the patient and her family.  She has an unstable left hip fracture that requires surgical stabilization for pain control and to allow for mobilization.  We discussed the risk, benefits, and alternatives to intramedullary fixation of left femur.  Plan for surgery later today.  All questions were solicited and answered.  The risks, benefits, and alternatives were discussed with the patient. There are risks associated with the surgery including, but not limited to, problems with anesthesia (death), infection, differences in leg length/angulation/rotation, fracture of bones, loosening or failure of implants, malunion, nonunion, hematoma (blood accumulation) which may require surgical drainage, blood clots, pulmonary embolism, nerve injury (foot drop), and blood vessel injury. The patient understands these risks and elects to proceed.   Bertram Savin, MD [phone]    02/24/2020 4:11 PM

## 2020-02-24 NOTE — Transfer of Care (Signed)
Immediate Anesthesia Transfer of Care Note  Patient: Cathy Martinez  Procedure(s) Performed: INTRAMEDULLARY (IM) NAIL INTERTROCHANTRIC (Left )  Patient Location: PACU  Anesthesia Type:Spinal  Level of Consciousness: awake, alert  and oriented  Airway & Oxygen Therapy: Patient Spontanous Breathing and Patient connected to face mask oxygen  Post-op Assessment: Report given to RN and Post -op Vital signs reviewed and stable  Post vital signs: Reviewed and stable  Last Vitals:  Vitals Value Taken Time  BP 143/117 02/24/20 1851  Temp    Pulse 69 02/24/20 1852  Resp 15 02/24/20 1852  SpO2 80 % 02/24/20 1852  Vitals shown include unvalidated device data.  Last Pain:  Vitals:   02/24/20 1524  TempSrc:   PainSc: 8       Patients Stated Pain Goal: 0 (02/24/20 0830)  Complications: No complications documented.

## 2020-02-24 NOTE — Anesthesia Preprocedure Evaluation (Addendum)
Anesthesia Evaluation  Patient identified by MRN, date of birth, ID band Patient awake    Reviewed: Allergy & Precautions, H&P , NPO status , Patient's Chart, lab work & pertinent test results  History of Anesthesia Complications (+) PONV  Airway Mallampati: II  TM Distance: >3 FB Neck ROM: Full    Dental no notable dental hx. (+) Teeth Intact, Dental Advisory Given   Pulmonary asthma ,    Pulmonary exam normal breath sounds clear to auscultation       Cardiovascular negative cardio ROS   Rhythm:Regular Rate:Normal     Neuro/Psych Dementia negative neurological ROS     GI/Hepatic Neg liver ROS, GERD  Medicated,  Endo/Other  negative endocrine ROS  Renal/GU negative Renal ROS  negative genitourinary   Musculoskeletal  (+) Arthritis , Osteoarthritis,    Abdominal   Peds  Hematology  (+) Blood dyscrasia, anemia ,   Anesthesia Other Findings   Reproductive/Obstetrics negative OB ROS                            Anesthesia Physical Anesthesia Plan  ASA: II  Anesthesia Plan: MAC and Spinal   Post-op Pain Management:    Induction: Intravenous  PONV Risk Score and Plan: 4 or greater and Propofol infusion, Ondansetron and Treatment may vary due to age or medical condition  Airway Management Planned: Simple Face Mask  Additional Equipment:   Intra-op Plan:   Post-operative Plan:   Informed Consent: I have reviewed the patients History and Physical, chart, labs and discussed the procedure including the risks, benefits and alternatives for the proposed anesthesia with the patient or authorized representative who has indicated his/her understanding and acceptance.     Dental advisory given  Plan Discussed with: CRNA  Anesthesia Plan Comments:         Anesthesia Quick Evaluation

## 2020-02-25 LAB — CBC
HCT: 30.3 % — ABNORMAL LOW (ref 36.0–46.0)
Hemoglobin: 10 g/dL — ABNORMAL LOW (ref 12.0–15.0)
MCH: 32.7 pg (ref 26.0–34.0)
MCHC: 33 g/dL (ref 30.0–36.0)
MCV: 99 fL (ref 80.0–100.0)
Platelets: 162 10*3/uL (ref 150–400)
RBC: 3.06 MIL/uL — ABNORMAL LOW (ref 3.87–5.11)
RDW: 13.3 % (ref 11.5–15.5)
WBC: 8.6 10*3/uL (ref 4.0–10.5)
nRBC: 0 % (ref 0.0–0.2)

## 2020-02-25 LAB — BASIC METABOLIC PANEL
Anion gap: 6 (ref 5–15)
BUN: 12 mg/dL (ref 8–23)
CO2: 25 mmol/L (ref 22–32)
Calcium: 7.5 mg/dL — ABNORMAL LOW (ref 8.9–10.3)
Chloride: 100 mmol/L (ref 98–111)
Creatinine, Ser: 0.63 mg/dL (ref 0.44–1.00)
GFR, Estimated: >60 mL/min (ref >60)
Glucose, Bld: 148 mg/dL — ABNORMAL HIGH (ref 70–99)
Potassium: 4 mmol/L (ref 3.5–5.1)
Sodium: 131 mmol/L — ABNORMAL LOW (ref 135–145)

## 2020-02-25 MED ORDER — HYDROCODONE-ACETAMINOPHEN 7.5-325 MG PO TABS: 1.0000 | ORAL_TABLET | ORAL | 0 refills | Status: AC | PRN

## 2020-02-25 MED ORDER — CHLORHEXIDINE GLUCONATE CLOTH 2 % EX PADS
6.0000 | MEDICATED_PAD | Freq: Every day | CUTANEOUS | Status: DC
  Administered 2020-02-25 – 2020-02-26 (×2): 6 via TOPICAL

## 2020-02-25 MED ORDER — ASPIRIN 81 MG PO CHEW: 81.0000 mg | CHEWABLE_TABLET | Freq: Every day | ORAL | 0 refills | Status: AC

## 2020-02-25 NOTE — TOC Initial Note (Addendum)
Transition of Care [facility]) - Initial/Assessment Note    Patient Details  Name: Cathy Martinez MRN: [id number] Date of Birth: [birth year]  Transition of Care [facility]) CM/SW Contact:    Lia Hopping, [place] Phone Number: 02/25/2020, 1:57 PM  Clinical Narrative:  Re: SNF placement                 Patient admitted after a fall at home. Patient suffered a left hip fracture and underwent a surgical intervention.  Patient visiting with Doristine Bosworth during time of visit.  CSW met with the patient at discharge to discuss discharge plan to SNF for short rehab.Son reports the patient went to [facility] ago about 4 years ago after a fall and transitioned well home. Son prefers the patient return if a bed is available. He reports the patient is normally independent with her ADL's, drives and walks her community without an assistive device. He reports the patient usually more alert and oriented however since being in the hospital she has been more confused. He thinks the pain medication and lack of sleep are contributing to it.  CSW made a referral to preference [facility] and other SNF's in the area.   HTA authorization initiated.  Expected Discharge Plan: Skilled Nursing Facility Barriers to Discharge: Insurance Authorization,Continued Medical Work up   Patient Goals and CMS Choice   CMS Medicare.gov Compare Post Acute Care list provided to::  (Son-Mike) Choice offered to / list presented to : Adult Children  Expected Discharge Plan and Services Expected Discharge Plan: [place] In-house Referral: Clinical Social Work Discharge Planning Services: CM Consult Post Acute Care Choice: [place] arrangements for the past 2 months: [place]                                      Prior Living Arrangements/Services Living arrangements for the past 2 months: Single Family Home Lives with:: Self Patient language and need for interpreter reviewed:: No Do  you feel safe going back to the place where you live?: Yes      Need for Family Participation in Patient Care: Yes (Comment) Care giver support system in place?: Yes (comment)   Criminal Activity/Legal Involvement Pertinent to Current Situation/Hospitalization: No - Comment as needed  Activities of Daily Living Home Assistive Devices/Equipment: Cane (specify quad or straight) (straight) ADL Screening (condition at time of admission) Patient's cognitive ability adequate to safely complete daily activities?: Yes Is the patient deaf or have difficulty hearing?: No Does the patient have difficulty seeing, even when wearing glasses/contacts?: No Does the patient have difficulty concentrating, remembering, or making decisions?: No Patient able to express need for assistance with ADLs?: Yes Does the patient have difficulty dressing or bathing?: Yes Independently performs ADLs?: No Communication: Independent Dressing (OT): Needs assistance Is this a change from baseline?: Change from baseline, expected to last >3 days (Pt in with a fall) Grooming: Needs assistance Is this a change from baseline?: Change from baseline, expected to last >3 days Feeding: Independent Bathing: Needs assistance Is this a change from baseline?: Change from baseline, expected to last >3 days (pt in with a fall) Toileting: Needs assistance Is this a change from baseline?: Change from baseline, expected to last >3days In/Out Bed: Needs assistance Is this a change from baseline?: Change from baseline, expected to last >3 days (pt in with a fall) Walks in Home: Needs assistance  Is this a change from baseline?: Change from baseline, expected to last >3 days Does the patient have difficulty walking or climbing stairs?: Yes Weakness of Legs: Both Weakness of Arms/Hands: None  Permission Sought/Granted Permission sought to share information with : Family Supports Permission granted to share information with : Yes, Verbal  Permission Granted     Permission granted to share info w AGENCY: SNF's in the area & [facility]  Permission granted to share info w Relationship: Adult Children     Emotional Assessment Appearance:: Appears stated age Attitude/Demeanor/Rapport: Engaged Affect (typically observed): Accepting Orientation: : Oriented to Self,Oriented to Place,Oriented to  Time,Oriented to Situation Alcohol / Substance Use: Not Applicable Psych Involvement: No (comment)  Admission diagnosis:  Closed left femoral fracture ([place]) [S72.92XA] Fall, initial encounter [W19.XXXA] Closed fracture of neck of left femur, initial encounter ([place]) [S72.002A] Closed comminuted intertrochanteric fracture of left femur ([place]) [S72.142A] Patient Active Problem List   Diagnosis Date Noted  . GERD (gastroesophageal reflux disease) 02/23/2020  . Asthma 02/23/2020  . Rheumatoid arthritis ([employer]) 02/23/2020  . Fall at home, initial encounter 02/23/2020  . Closed left femoral fracture ([place]) 02/23/2020  . Dementia ([place]) 02/23/2020  . Expected blood loss anemia 03/25/2013  . S/P right TKA 03/23/2013  . Small bowel obstruction due to adhesions ([place]) 02/01/2013   PCP:  Tamsen Roers, MD Pharmacy:   [facility] [address] [place]), [place] [address] [place]) [place] [address] Phone: [phone] Fax: [phone]     Social Determinants of Health (SDOH) Interventions    Readmission Risk Interventions No flowsheet data found.

## 2020-02-25 NOTE — Progress Notes (Signed)
    Subjective:  Patient reports pain as mild to moderate.  Denies N/V/CP/SOB.   Objective:   VITALS:   Vitals:   02/24/20 2250 02/24/20 2345 02/25/20 0155 02/25/20 0551  BP: 140/71 137/65 (!) 93/45 (!) 116/54  Pulse: 80 75 69 62  Resp: 18 18 17 17   Temp: 98.5 F (36.9 C) 98.1 F (36.7 C) 97.7 F (36.5 C) 98.2 F (36.8 C)  TempSrc: Oral Oral Oral Oral  SpO2: 100% 100% 91% 97%  Weight:      Height:        NAD ABD soft Neurovascular intact Sensation intact distally Intact pulses distally Dorsiflexion/Plantar flexion intact Incision: dressing C/D/I   Lab Results  Component Value Date   WBC 8.6 02/25/2020   HGB 10.0 (L) 02/25/2020   HCT 30.3 (L) 02/25/2020   MCV 99.0 02/25/2020   PLT 162 02/25/2020   BMET    Component Value Date/Time   NA 131 (L) 02/25/2020 0616   K 4.0 02/25/2020 0616   CL 100 02/25/2020 0616   CO2 25 02/25/2020 0616   GLUCOSE 148 (H) 02/25/2020 0616   BUN 12 02/25/2020 0616   CREATININE 0.63 02/25/2020 0616   CALCIUM 7.5 (L) 02/25/2020 0616   GFRNONAA >60 02/25/2020 0616   GFRAA >60 06/30/2018 1345     Assessment/Plan: 1 Day Post-Op   Principal Problem:   Closed left femoral fracture (HCC) Active Problems:   GERD (gastroesophageal reflux disease)   Asthma   Rheumatoid arthritis (HCC)   Fall at home, initial encounter   Dementia (HCC)   50% weight bearing RLE with walker DVT ppx: Aspirin, SCDs, TEDS PO pain control PT/OT Dispo: D/C to SNF likely once cleared by PT and OT     08/30/2018 02/25/2020, 11:01 AM  [facility] is now [facility] [address], [place], [place] Phone: [phone] [email or web address] Facebook  [phone]

## 2020-02-25 NOTE — Evaluation (Signed)
Occupational Therapy Evaluation Patient Details Name: Cathy Martinez MRN: [id number] DOB: November 17, 1931 Today's Date: 02/25/2020    History of Present Illness Pt underwent LT femur IM fixation on 02/24/20 following a fall at home. PMH: COPD, RT TKA 2015, RA. Son reports h/o dementia.   Clinical Impression   Patient is currently requiring assistance with ADLs including Total assist with toileting, Total assist with LE dressing, maximum with bathing while seated, and moderate assist with UE dressing as well as Total assist of 2 people for bed mobility and Max assistance of 2 people for stand pivot to recliner, all of which is below patient's typical baseline of being Independent to Modified independent .  During this evaluation, patient was limited by baseline dementia with anxiety, PWB of 50% to LLE, pain, and generalized weakness, which has the potential to impact patient's safety and independence during functional mobility, as well as performance for ADLs. [place] "6-clicks" Daily Activity Inpatient Short Form score of 12/24 indicates 66.57% ADL impairment this session. Patient lives alone in a 2 story home with 4 steps to enter, but has the options of staying with family members for 24/7 assistance while she recovers.  Patient demonstrates good rehab potential, and should benefit from continued skilled occupational therapy services while in acute care to maximize safety, independence and quality of life at home.  Continued occupational therapy services in a SNF setting prior to return home is recommended.  ?   Follow Up Recommendations  SNF    Equipment Recommendations  3 in 1 bedside commode;Tub/shower bench    Recommendations for Other Services       Precautions / Restrictions Precautions Precautions: Fall Restrictions Weight Bearing Restrictions: Yes LLE Weight Bearing: Partial weight bearing LLE Partial Weight Bearing Percentage or Pounds: 50% PWB      Mobility  Bed Mobility Overal bed mobility: Needs Assistance Bed Mobility: Supine to Sit     Supine to sit: HOB elevated;+2 for physical assistance;Total assist     General bed mobility comments: Pt attempting to assist initially with bed mobility, however pt froze and ulitmately required total assist of two people supporting trunk and BLEs.    Transfers Overall transfer level: Needs assistance   Transfers: Sit to/from Stand;Stand Pivot Transfers Sit to Stand: Max assist Stand pivot transfers: Max assist;+2 physical assistance       General transfer comment: No AD used due to pt confusion and anxiety. Please refer to PT Evaluation for more details on stand pivot t/f. Cues for PWB of 50%.    Balance Overall balance assessment: History of Falls;Needs assistance Sitting-balance support: Single extremity supported Sitting balance-Leahy Scale: Poor Sitting balance - Comments: Pt leaning far to RT to offset pressure to LE hip.   Standing balance support: Bilateral upper extremity supported Standing balance-Leahy Scale: Poor                             ADL either performed or assessed with clinical judgement   ADL Overall ADL's : Needs assistance/impaired Eating/Feeding: Set up;Supervision/ safety Eating/Feeding Details (indicate cue type and reason): Needs verbal cues to initiate feeding and to continue. Grooming: Set up;Sitting;Bed level   Upper Body Bathing: Minimal assistance;Bed level   Lower Body Bathing: Bed level;Maximal assistance   Upper Body Dressing : Moderate assistance;Sitting   Lower Body Dressing: Total assistance;Sitting/lateral leans;Bed level     Toilet Transfer Details (indicate cue type and reason): Please see Mobility section. Toileting-  Clothing Manipulation and Hygiene: Total assistance;Bed level       Functional mobility during ADLs: Maximal assistance;+2 for physical assistance;Cueing for safety;Cueing for sequencing       Vision    Vision Assessment?: No apparent visual deficits     Perception     Praxis      Pertinent Vitals/Pain Pain Assessment: 0-10 Pain Score: 5  Pain Location: Lower back and LT LLE.  Increase in pain once mobilized, however pt did not quantify. Pain Intervention(s): Limited activity within patient's tolerance;Other (comment);Relaxation;Repositioned;Premedicated before session;Monitored during session (Coordinated time of therapy with RN to ensure pt was premedicated.)     Hand Dominance Right   Extremity/Trunk Assessment Upper Extremity Assessment Upper Extremity Assessment: Overall WFL for tasks assessed   Lower Extremity Assessment Lower Extremity Assessment: Defer to PT evaluation   Cervical / Trunk Assessment Cervical / Trunk Assessment: Normal   Communication Communication Communication: No difficulties   Cognition Arousal/Alertness: Awake/alert Behavior During Therapy: Anxious Overall Cognitive Status: History of cognitive impairments - at baseline                                 General Comments: Pt alert and oriented, states month is October and year is 2002. Son reports h/o mild dementia.  Pt continues to drive and perform own grocery shopping.   General Comments       Exercises     Shoulder Instructions      Home Living Family/patient expects to be discharged to:: Skilled nursing facility Living Arrangements: Alone Available Help at Discharge: Family;Friend(s);Available 24 hours/day (Pt does not have 24/7 assistance at baseline, but pt's son states that family can provide as long as needed while pt recovers.) Type of Home: House Home Access: Stairs to enter [employer] of Steps: 3 steps, landing, then 1 step Entrance Stairs-Rails: Right;Left (Wide apart) Home Layout: Two level Alternate Level Stairs-Number of Steps: Full flight down to basement where pt keeps her cats.  These stairs are apparently where pt fell.   Bathroom  Shower/Tub: Chief Strategy Officer: Standard     Home Equipment: Hand held shower head;Cane - single point   Additional Comments: Pt reported having grab bars in bathroom, but son clarified they are towel racks.  Son stated that pt's bathroom is too narrow to support a walker.      Prior Functioning/Environment Level of Independence: Independent        Comments: Pt reports independent with ADLs, community ambulation without AD, drives, cares for cats in basement, family nearby and women who comes in daily to assist with household chores.        OT Problem List: Pain;Decreased strength;Decreased activity tolerance;Decreased safety awareness;Impaired balance (sitting and/or standing);Decreased knowledge of use of DME or AE;Decreased knowledge of precautions      OT Treatment/Interventions: Self-care/ADL training;Therapeutic exercise;Therapeutic activities;Cognitive remediation/compensation;DME and/or AE instruction;Patient/family education;Balance training    OT Goals(Current goals can be found in the care plan section) Acute Rehab OT Goals Patient Stated Goal: Per son, for pt to go home if possible, but open to rehab possibilities. OT Goal Formulation: With patient/family Time For Goal Achievement: 03/10/20 Potential to Achieve Goals: Good ADL Goals Pt Will Perform Lower Body Bathing: sitting/lateral leans;with adaptive equipment;with supervision;sit to/from stand Pt Will Perform Lower Body Dressing: sit to/from stand;sitting/lateral leans;with adaptive equipment;with supervision Pt Will Transfer to Toilet: ambulating;regular height toilet;bedside commode;with supervision Pt Will Perform Toileting - Clothing  Manipulation and hygiene: with adaptive equipment;sitting/lateral leans;sit to/from stand;with modified independence Additional ADL Goal #1: Pt will improve sitting balance to good static and fair+ dynamic in order to increase safety and participation during seated  ADLs.  OT Frequency: Min 2X/week   Barriers to D/C:    Pt lives alone at baseline. Stairs into home and full flight down to basement to feed cats.       Co-evaluation PT/OT/SLP Co-Evaluation/Treatment: Yes Reason for Co-Treatment: For patient/therapist safety;Necessary to address cognition/behavior during functional activity PT goals addressed during session: Mobility/safety with mobility OT goals addressed during session: ADL's and self-care      AM-PAC OT "6 Clicks" Daily Activity     Outcome Measure Help from another person eating meals?: A Little Help from another person taking care of personal grooming?: A Little Help from another person toileting, which includes using toliet, bedpan, or urinal?: Total Help from another person bathing (including washing, rinsing, drying)?: A Lot Help from another person to put on and taking off regular upper body clothing?: A Lot Help from another person to put on and taking off regular lower body clothing?: Total 6 Click Score: 12   End of Session Equipment Utilized During Treatment: Gait belt Nurse Communication: Mobility status  Activity Tolerance: Patient limited by pain Patient left: in chair;with family/visitor present;with call bell/phone within reach;with chair alarm set  OT Visit Diagnosis: Unsteadiness on feet (R26.81);History of falling (Z91.81);Pain;Other symptoms and signs involving cognitive function;Muscle weakness (generalized) (M62.81) Pain - Right/Left: Left Pain - part of body: Leg                Time: 0922-0955 OT Time Calculation (min): 33 min Charges:  OT General Charges $OT Visit: 1 Visit OT Evaluation $OT Eval Moderate Complexity: 1 Mod  Bhavya Eschete, OT Acute Rehab Services Office: [phone] 02/25/2020  Julien Girt 02/25/2020, 10:47 AM

## 2020-02-25 NOTE — Evaluation (Signed)
Physical Therapy Evaluation Patient Details Name: Cathy Martinez MRN: [id number] DOB: January 20, 1932 Today's Date: 02/25/2020   History of Present Illness  Pt underwent LT femur IM fixation on 02/24/20 following a fall at home. PMH: COPD, RT TKA 2015, RA. Son reports h/o dementia.  Clinical Impression  Pt admitted with above diagnosis. Pt requiring 2 person assist with getting to EOB and transfer to bedside chair due to pain. Pt requires heavy verbal cues for sequencing and to soothe pt, as well as increased time due to pain. Pt unable to attempt steps this session but appears to respect 50%WB LLE status with transfers. Pt's son present during eval correcting pt with home set up and DME, also encouraging pt throughout eval with mobility. Pt currently with functional limitations due to the deficits listed below (see PT Problem List). Pt will benefit from skilled PT to increase their independence and safety with mobility to allow discharge to the venue listed below.       Follow Up Recommendations SNF    Equipment Recommendations  Rolling walker with 5" wheels;3in1 (PT)    Recommendations for Other Services       Precautions / Restrictions Precautions Precautions: Fall Restrictions Weight Bearing Restrictions: Yes LLE Weight Bearing: Partial weight bearing LLE Partial Weight Bearing Percentage or Pounds: 50% WB      Mobility  Bed Mobility Overal bed mobility: Needs Assistance Bed Mobility: Supine to Sit  Supine to sit: HOB elevated;+2 for physical assistance;Total assist    General bed mobility comments: pt attempts to slide BLE ~2 inches towards EOB but unable, requires +2 assist to get to EOB    Transfers Overall transfer level: Needs assistance   Transfers: Sit to/from Stand;Stand Pivot Transfers Sit to Stand: Max assist Stand pivot transfers: Max assist;+2 physical assistance  General transfer comment: transfers with therapist anterior to pt and no AD due to pt confusion and  anxiety, able to rise with assist using bedpad and therapist preventing BLE from buckling, verbalizes understanding of WB status and appears to maintain restriction; pivot requires max A +2 with constant encouragement and soothing, unsteady throughout transfer, appears to rest L toe on floor throughout transfer, cued for hand placement with transfer  Ambulation/Gait  General Gait Details: unable  Stairs            Wheelchair Mobility    Modified Rankin (Stroke Patients Only)       Balance Overall balance assessment: History of Falls;Needs assistance Sitting-balance support: Single extremity supported Sitting balance-Leahy Scale: Poor Sitting balance - Comments: pt leaning on R forearm avoiding LLE   Standing balance support: Bilateral upper extremity supported Standing balance-Leahy Scale: Poor Standing balance comment: max A with BUE support        Pertinent Vitals/Pain Pain Assessment: 0-10 Pain Score: 5  Pain Location: Lower back and LT LLE.  Increase in pain once mobilized, however pt did not quantify. Pain Descriptors / Indicators: Grimacing;Guarding Pain Intervention(s): Limited activity within patient's tolerance;Monitored during session;Premedicated before session;Repositioned;Ice applied    Home Living Family/patient expects to be discharged to:: Skilled nursing facility Living Arrangements: Alone Available Help at Discharge: Family;Friend(s);Available 24 hours/day (Pt does not have 24/7 assistance at baseline, but pt's son states that family can provide as long as needed while pt recovers.) Type of Home: House Home Access: Stairs to enter Entrance Stairs-Rails: Right;Left (Wide apart) Entrance Stairs-Number of Steps: 3 steps, landing, then 1 step Home Layout: Two level Home Equipment: Hand held shower head;Cane - single point Additional  Comments: Pt reported having grab bars in bathroom, but son clarified they are towel racks.  Son stated that pt's bathroom  is too narrow to support a walker.    Prior Function Level of Independence: Independent         Comments: Pt reports independent with ADLs, community ambulation without AD, drives, cares for cats in basement, family nearby and women who comes in daily to assist with household chores.     Hand Dominance   Dominant Hand: Right    Extremity/Trunk Assessment   Upper Extremity Assessment Upper Extremity Assessment: Defer to OT evaluation    Lower Extremity Assessment Lower Extremity Assessment: Generalized weakness;LLE deficits/detail LLE Deficits / Details: actively wiggling toes, adducts LE ~2 inches in bed, limited due to pain LLE: Unable to fully assess due to pain    Cervical / Trunk Assessment Cervical / Trunk Assessment: Normal  Communication   Communication: No difficulties  Cognition Arousal/Alertness: Awake/alert Behavior During Therapy: WFL for tasks assessed/performed;Anxious Overall Cognitive Status: History of cognitive impairments - at baseline  General Comments: Pt alert and oriented, states month is October and year is 2002. Son reports h/o mild dementia.      General Comments      Exercises     Assessment/Plan    PT Assessment Patient needs continued PT services  PT Problem List Decreased strength;Decreased range of motion;Decreased activity tolerance;Decreased balance;Decreased mobility;Decreased knowledge of use of DME;Decreased safety awareness;Pain       PT Treatment Interventions DME instruction;Gait training;Functional mobility training;Therapeutic activities;Therapeutic exercise;Balance training;Patient/family education    PT Goals (Current goals can be found in the Care Plan section)  Acute Rehab PT Goals Patient Stated Goal: Per son, for pt to go home if possible, but open to rehab possibilities. PT Goal Formulation: With family Time For Goal Achievement: 03/10/20 Potential to Achieve Goals: Good    Frequency Min 2X/week   Barriers  to discharge        Co-evaluation   Reason for Co-Treatment: Complexity of the patient's impairments (multi-system involvement);For patient/therapist safety;To address functional/ADL transfers PT goals addressed during session: Mobility/safety with mobility;Balance;Proper use of DME OT goals addressed during session: ADL's and self-care       AM-PAC PT "6 Clicks" Mobility  Outcome Measure Help needed turning from your back to your side while in a flat bed without using bedrails?: Total Help needed moving from lying on your back to sitting on the side of a flat bed without using bedrails?: Total Help needed moving to and from a bed to a chair (including a wheelchair)?: Total Help needed standing up from a chair using your arms (e.g., wheelchair or bedside chair)?: Total Help needed to walk in hospital room?: Total Help needed climbing 3-5 steps with a railing? : Total 6 Click Score: 6    End of Session Equipment Utilized During Treatment: Gait belt Activity Tolerance: Patient limited by pain Patient left: in chair;with call bell/phone within reach;with chair alarm set;with family/visitor present Nurse Communication: Mobility status PT Visit Diagnosis: Unsteadiness on feet (R26.81);Other abnormalities of gait and mobility (R26.89);Muscle weakness (generalized) (M62.81);Pain Pain - Right/Left: Left Pain - part of body: Hip    Time: 0922-0955 PT Time Calculation (min) (ACUTE ONLY): 33 min   Charges:   PT Evaluation $PT Eval Moderate Complexity: 1 Mod           Tori Diedra Sinor PT, DPT 02/25/20, 1:34 PM

## 2020-02-25 NOTE — Progress Notes (Signed)
[facility]  PROGRESS NOTE  Cathy Martinez [id number] DOB: Oct 04, 1931 DOA: 02/23/2020 PCP: Tamsen Roers, MD   Brief HPI:   87 year old female with history of mild dementia, GERD, mild COPD, remote arthritis, sciatica, asthma, diverticular disease who lives alone apparently sustained a mechanical fall yesterday when she slipped and fell.  She complained of pain in the left side of hip.  Work-up showed displaced femoral neck fracture on the left.  Orthopedics was consulted.  COVID-19 screen was negative.    Subjective   Patient seen and examined, status post surgery yesterday.  Pain well controlled.   Assessment/Plan:     1. Displaced left femoral neck fracture-status post intramedullary fixation.  Orthopedic surgery following.  Pain adequately controlled.   2. Dementia-at baseline 3. GERD-continue PPI. 4. Mechanical fall-PT/OT after surgery, may require placement in skilled facility. 5. History of asthma-continue as needed albuterol     COVID-19 Labs  No results for input(s): DDIMER, FERRITIN, LDH, CRP in the last 72 hours.  Lab Results  Component Value Date   [place] NEGATIVE 02/23/2020     Scheduled medications:   . vitamin C  500 mg Oral Daily  . Chlorhexidine Gluconate Cloth  6 each Topical Daily  . docusate sodium  100 mg Oral BID  . enoxaparin (LOVENOX) injection  40 mg Subcutaneous Q24H  . psyllium  1 packet Oral Daily         SpO2: 95 % O2 Flow Rate (L/min): 2 L/min    CBC: Recent Labs  Lab 02/23/20 2148 02/24/20 0500 02/25/20 0616  WBC 12.3* 10.8* 8.6  NEUTROABS 9.6*  --   --   HGB 14.9 13.5 10.0*  HCT 44.9 40.0 30.3*  MCV 99.6 97.6 99.0  PLT 223 190 0000000    Basic Metabolic Panel: Recent Labs  Lab 02/23/20 2148 02/24/20 0500 02/25/20 0616  NA 138 137 131*  K 3.9 4.2 4.0  CL 103 103 100  CO2 24 24 25   GLUCOSE 161* 233* 148*  BUN 16 16 12   CREATININE 0.67 0.59 0.63  CALCIUM 8.8* 8.7* 7.5*     Liver Function  Tests: Recent Labs  Lab 02/24/20 0500  AST 18  ALT 14  ALKPHOS 46  BILITOT 0.9  PROT 6.2*  ALBUMIN 3.5     Antibiotics: Anti-infectives (From admission, onward)   Start     Dose/Rate Route Frequency Ordered Stop   02/24/20 2330  ceFAZolin (ANCEF) IVPB 2g/100 mL premix        2 g 200 mL/hr over 30 Minutes Intravenous Every 6 hours 02/24/20 2037 02/25/20 0557   02/24/20 1515  ceFAZolin (ANCEF) IVPB 2g/100 mL premix        2 g 200 mL/hr over 30 Minutes Intravenous On call to O.R. 02/24/20 1049 02/24/20 1725       DVT prophylaxis: Lovenox  Code Status: Full code  Family Communication: No family at bedside   Consultants:  Orthopedics  Procedures:      Objective   Vitals:   02/24/20 2345 02/25/20 0155 02/25/20 0551 02/25/20 1413  BP: 137/65 (!) 93/45 (!) 116/54 127/83  Pulse: 75 69 62 74  Resp: 18 17 17 17   Temp: 98.1 F (36.7 C) 97.7 F (36.5 C) 98.2 F (36.8 C) 97.6 F (36.4 C)  TempSrc: Oral Oral Oral Oral  SpO2: 100% 91% 97% 95%  Weight:      Height:        Intake/Output Summary (Last 24 hours) at 02/25/2020 1427 Last data  filed at 02/25/2020 1300 Gross per 24 hour  Intake 2983.39 ml  Output 945 ml  Net 2038.39 ml    01/04 1901 - 01/06 0700 In: 1786.3 [I.V.:1686.3] Out: 650 [Urine:550]  Filed Weights   02/24/20 0542  Weight: 45.5 kg    Physical Examination:   General-appears in no acute distress Heart-S1-S2, regular, no murmur auscultated Lungs-clear to auscultation bilaterally, no wheezing or crackles auscultated Abdomen-soft, nontender, no organomegaly Extremities-no edema in the lower extremities Neuro-alert, oriented x3, no focal deficit noted  Status is: Inpatient  Dispo: The patient is from: Home              Anticipated d/c is to: Skilled nursing facility              Anticipated d/c date is: 02/26/2020              Patient currently stable for discharge  Barrier to discharge-awaiting placement at skilled nursing  facility       Data Reviewed:   Recent Results (from the past 240 hour(s))  SARS CORONAVIRUS 2 (TAT 6-24 HRS) Nasopharyngeal Nasopharyngeal Swab     Status: None   Collection Time: 02/23/20 10:05 PM   Specimen: Nasopharyngeal Swab  Result Value Ref Range Status   SARS Coronavirus 2 NEGATIVE NEGATIVE Final    Comment: (NOTE) SARS-CoV-2 target nucleic acids are NOT DETECTED.  The SARS-CoV-2 RNA is generally detectable in upper and lower respiratory specimens during the acute phase of infection. Negative results do not preclude SARS-CoV-2 infection, do not rule out co-infections with other pathogens, and should not be used as the sole basis for treatment or other patient management decisions. Negative results must be combined with clinical observations, patient history, and epidemiological information. The expected result is Negative.  Fact Sheet for Patients: SugarRoll.be  Fact Sheet for Healthcare Providers: https://www.woods-mathews.com/  This test is not yet approved or cleared by the [place] FDA and  has been authorized for detection and/or diagnosis of SARS-CoV-2 by FDA under an Emergency Use Authorization (EUA). This EUA will remain  in effect (meaning this test can be used) for the duration of the COVID-19 declaration under Se ction 564(b)(1) of the Act, 21 U.S.C. section 360bbb-3(b)(1), unless the authorization is terminated or revoked sooner.  Performed at [facility], [place] [address], [place], [place] [address]   Surgical PCR screen     Status: None   Collection Time: 02/24/20  5:59 AM   Specimen: Nasal Mucosa; Nasal Swab  Result Value Ref Range Status   MRSA, PCR NEGATIVE NEGATIVE Final   Staphylococcus aureus NEGATIVE NEGATIVE Final    Comment: (NOTE) The Xpert SA Assay (FDA approved for NASAL specimens in patients 26 years of age and older), is one component of a comprehensive surveillance program. It  is not intended to diagnose infection nor to guide or monitor treatment. Performed at [facility], [place] [address] [place], [place] [address]     No results for input(s): LIPASE, AMYLASE in the last 168 hours. No results for input(s): AMMONIA in the last 168 hours.  Cardiac Enzymes: No results for input(s): CKTOTAL, CKMB, CKMBINDEX, TROPONINI in the last 168 hours. BNP (last 3 results) No results for input(s): BNP in the last 8760 hours.  ProBNP (last 3 results) No results for input(s): PROBNP in the last 8760 hours.  Studies:  Pelvis Portable  Result Date: 02/24/2020 CLINICAL DATA:  Left hip fracture repair EXAM: PORTABLE PELVIS 1-2 VIEWS COMPARISON:  02/23/2020  FINDINGS: Two frontal views of the pelvis are obtained. The iliac crests are excluded by collimation. Intramedullary rod with proximal dynamic screw and distal interlocking screw traverse the inter trochanteric left hip fracture seen previously. Alignment is near anatomic. Postsurgical changes are seen in the soft tissues. Utilized portions of the right femur and remainder of the bony pelvis are unremarkable. IMPRESSION: 1. ORIF left hip fracture, with near anatomic alignment. Electronically Signed   By: Randa Ngo M.D.   On: 02/24/2020 20:17   DG Chest Port 1 View  Result Date: 02/23/2020 CLINICAL DATA:  Preoperative assessment for hip fracture EXAM: PORTABLE CHEST 1 VIEW COMPARISON:  02/01/2013 FINDINGS: Single frontal view of the chest demonstrates an unremarkable cardiac silhouette. No airspace disease, effusion, or pneumothorax. No acute bony abnormalities. IMPRESSION: 1. No acute intrathoracic process. Electronically Signed   By: Randa Ngo M.D.   On: 02/23/2020 22:31   DG Knee Left Port  Result Date: 02/23/2020 CLINICAL DATA:  Status post fall. EXAM: PORTABLE LEFT KNEE - 1-2 VIEW COMPARISON:  None. FINDINGS: No evidence of an acute fracture, dislocation or joint effusion. There is moderate  severity narrowing of the medial tibiofemoral compartment space. Soft tissue structures are unremarkable. IMPRESSION: 1. Degenerative changes without evidence of an acute fracture or dislocation. Electronically Signed   By: Virgina Norfolk M.D.   On: 02/23/2020 23:21   DG C-Arm 1-60 Min-No Report  Result Date: 02/24/2020 CLINICAL DATA:  ORIF left femur fracture EXAM: LEFT FEMUR 2 VIEWS; DG C-ARM 1-60 MIN-NO REPORT COMPARISON:  02/23/2020 FINDINGS: Subtrochanteric fracture repaired with a compression screw into the femoral head and locking intramedullary rod extending to the distal femur. Satisfactory fracture alignment. Satisfactory hardware placement. IMPRESSION: Rod fixation of subtrochanteric fracture in satisfactory alignment. Electronically Signed   By: Franchot Gallo M.D.   On: 02/24/2020 18:56   DG Hip Unilat With Pelvis 2-3 Views Left  Result Date: 02/23/2020 CLINICAL DATA:  87 year old female with fall. EXAM: DG HIP (WITH OR WITHOUT PELVIS) 2-3V LEFT COMPARISON:  Pelvic radiograph dated 06/21/2003. FINDINGS: There is a displaced and angulated fracture of the left femoral neck with trochanteric and subtrochanteric extension. There is varus angulation. No dislocation. Mild osteopenia. The soft tissues are unremarkable. IMPRESSION: Displaced and angulated left femoral neck fracture. Electronically Signed   By: Anner Crete M.D.   On: 02/23/2020 22:19   DG FEMUR MIN 2 VIEWS LEFT  Result Date: 02/24/2020 CLINICAL DATA:  ORIF left femur fracture EXAM: LEFT FEMUR 2 VIEWS; DG C-ARM 1-60 MIN-NO REPORT COMPARISON:  02/23/2020 FINDINGS: Subtrochanteric fracture repaired with a compression screw into the femoral head and locking intramedullary rod extending to the distal femur. Satisfactory fracture alignment. Satisfactory hardware placement. IMPRESSION: Rod fixation of subtrochanteric fracture in satisfactory alignment. Electronically Signed   By: Franchot Gallo M.D.   On: 02/24/2020 18:56   DG FEMUR  PORT MIN 2 VIEWS LEFT  Result Date: 02/24/2020 CLINICAL DATA:  Left intramedullary nail placement EXAM: LEFT FEMUR PORTABLE 2 VIEWS COMPARISON:  02/23/2020 FINDINGS: Frontal and cross-table lateral views of the left femur are obtained. Intramedullary rod with proximal dynamic screw and distal interlocking screw traverse the comminuted intertrochanteric left hip fracture seen previously. Alignment is near anatomic. Postsurgical changes are seen in the soft tissues. IMPRESSION: 1. ORIF of a left hip fracture, with near anatomic alignment. Electronically Signed   By: Randa Ngo M.D.   On: 02/24/2020 20:16       [place]   [facility] If 7PM-7AM,  please contact night-coverage at [email or web address], Office  [phone]   02/25/2020, 2:27 PM  LOS: 2 days

## 2020-02-25 NOTE — TOC Progression Note (Addendum)
Transition of Care [facility]) - Progression Note    Patient Details  Name: Cathy Martinez MRN: [id number] Date of Birth: 1931-09-08  Transition of Care [facility]) CM/SW Contact  Clearance Coots, [place] Phone Number: 02/25/2020, 5:21 PM  Clinical Narrative:    [facility] HTA approval [id number] 7 days [phone]  for ambulance transport approval Patient son [name] notified.  Covid test ordered by physician     Expected Discharge Plan: Skilled Nursing Facility Barriers to Discharge: Insurance Authorization,Continued Medical Work up  Expected Discharge Plan and Services Expected Discharge Plan: Skilled Nursing Facility In-house Referral: Clinical Social Work Discharge Planning Services: CM Consult Post Acute Care Choice: Skilled Nursing Facility Living arrangements for the past 2 months: Single Family Home                                       Social Determinants of Health (SDOH) Interventions    Readmission Risk Interventions No flowsheet data found.

## 2020-02-25 NOTE — NC FL2 (Addendum)
[place] LEVEL OF CARE SCREENING TOOL     IDENTIFICATION  Patient Name: Cathy Martinez Birthdate: [birth year] Sex: female Admission Date (Current Location): 02/23/2020  [facility] and [place] Number:  Herbalist and Address:  [facility],  [place], [place]      Provider Number: [id number]  Attending Physician Name and Address:  Oswald Hillock, MD  Relative Name and Phone Number:  hunt,ellen Daughter   [phone]   Rollene Fare   [phone]    Marybelle Killings [phone]  [phone]    Current Level of Care: Hospital Recommended Level of Care: [place] Prior Approval Number:    Date Approved/Denied:   PASRR Number:   [id number]  Discharge Plan: SNF    Current Diagnoses: Patient Active Problem List   Diagnosis Date Noted  . GERD (gastroesophageal reflux disease) 02/23/2020  . Asthma 02/23/2020  . Rheumatoid arthritis ([place]) 02/23/2020  . Fall at home, initial encounter 02/23/2020  . Closed left femoral fracture ([place]) 02/23/2020  . Dementia ([place]) 02/23/2020  . Expected blood loss anemia 03/25/2013  . S/P right TKA 03/23/2013  . Small bowel obstruction due to adhesions ([place]) 02/01/2013    Orientation RESPIRATION BLADDER Height & Weight     Self ,Place  Normal Continent Weight: 100 lb 5 oz (45.5 kg) Height:  5' (152.4 cm)  BEHAVIORAL SYMPTOMS/MOOD NEUROLOGICAL BOWEL NUTRITION STATUS      Continent Diet (Regular Diet)  AMBULATORY STATUS COMMUNICATION OF NEEDS Skin   Extensive Assist Verbally Surgical wounds                       Personal Care Assistance Level of Assistance  Bathing,Feeding,Dressing Bathing Assistance: Maximum assistance Feeding assistance: Independent Dressing Assistance: Maximum assistance     Functional Limitations Info  Sight,Hearing,Speech Sight Info: Adequate Hearing Info: Adequate Speech Info: Adequate    SPECIAL CARE FACTORS FREQUENCY  OT (By licensed  OT),PT (By licensed PT)     PT Frequency: 5x/week OT Frequency: 5x/week            Contractures Contractures Info: Not present    Additional Factors Info  Code Status,Allergies Code Status Info: Fullcode Allergies Info: Allergies: Codeine, Penicillins           Current Medications (02/25/2020):  This is the current hospital active medication list Current Facility-Administered Medications  Medication Dose Route Frequency Provider Last Rate Last Admin  . acetaminophen (TYLENOL) suppository 650 mg  650 mg Rectal Q6H PRN Swinteck, Aaron Edelman, MD      . acetaminophen (TYLENOL) tablet 325-650 mg  325-650 mg Oral Q6H PRN Rod Can, MD   650 mg at 02/25/20 0002  . albuterol (VENTOLIN HFA) 108 (90 Base) MCG/ACT inhaler 1 puff  1 puff Inhalation Q6H PRN Swinteck, Aaron Edelman, MD      . ascorbic acid (VITAMIN C) tablet 500 mg  500 mg Oral Daily Rod Can, MD   500 mg at 02/25/20 0919  . Chlorhexidine Gluconate Cloth 2 % PADS 6 each  6 each Topical Daily Oswald Hillock, MD   6 each at 02/25/20 0919  . dextrose 5 %-0.45 % sodium chloride infusion   Intravenous Continuous Rod Can, MD 75 mL/hr at 02/25/20 0918 New Bag at 02/25/20 [id number]  . docusate sodium (COLACE) capsule 100 mg  100 mg Oral BID Rod Can, MD   100 mg at 02/25/20 0919  . enoxaparin (LOVENOX) injection 40 mg  40 mg Subcutaneous  Q24H Samson Frederic, MD   40 mg at 02/25/20 0824  . HYDROcodone-acetaminophen (NORCO) 7.5-325 MG per tablet 1-2 tablet  1-2 tablet Oral Q4H PRN Samson Frederic, MD   2 tablet at 02/25/20 0924  . HYDROcodone-acetaminophen (NORCO/VICODIN) 5-325 MG per tablet 1-2 tablet  1-2 tablet Oral Q4H PRN Swinteck, Arlys John, MD      . lip balm (CARMEX) ointment 1 application  1 application Topical PRN Swinteck, Arlys John, MD      . menthol-cetylpyridinium (CEPACOL) lozenge 3 mg  1 lozenge Oral PRN Swinteck, Arlys John, MD       Or  . phenol (CHLORASEPTIC) mouth spray 1 spray  1 spray Mouth/Throat PRN Swinteck, Arlys John, MD       . metoCLOPramide (REGLAN) tablet 5 mg  5 mg Oral Q8H PRN Swinteck, Arlys John, MD       Or  . metoCLOPramide (REGLAN) injection 5 mg  5 mg Intravenous Q8H PRN Swinteck, Arlys John, MD      . morphine 2 MG/ML injection 0.5-1 mg  0.5-1 mg Intravenous Q2H PRN Samson Frederic, MD   1 mg at 02/25/20 1036  . omeprazole (PRILOSEC) capsule 20 mg  20 mg Oral Daily PRN Swinteck, Arlys John, MD      . ondansetron [facility]) tablet 4 mg  4 mg Oral Q6H PRN Swinteck, Arlys John, MD       Or  . ondansetron (ZOFRAN) injection 4 mg  4 mg Intravenous Q6H PRN Samson Frederic, MD      . psyllium (HYDROCIL/METAMUCIL) 1 packet  1 packet Oral Daily Samson Frederic, MD   1 packet at 02/25/20 0919     Discharge Medications: Please see discharge summary for a list of discharge medications.  Relevant Imaging Results:  Relevant Lab Results:   Additional Information ssn:[id number]  Clearance Coots, LCSW

## 2020-02-26 ENCOUNTER — Encounter (HOSPITAL_COMMUNITY): Payer: Self-pay | Admitting: Orthopedic Surgery

## 2020-02-26 DIAGNOSIS — K565 Intestinal adhesions [bands], unspecified as to partial versus complete obstruction: Secondary | ICD-10-CM | POA: Diagnosis not present

## 2020-02-26 DIAGNOSIS — R059 Cough, unspecified: Secondary | ICD-10-CM | POA: Diagnosis not present

## 2020-02-26 DIAGNOSIS — S72002A Fracture of unspecified part of neck of left femur, initial encounter for closed fracture: Secondary | ICD-10-CM | POA: Diagnosis not present

## 2020-02-26 DIAGNOSIS — K5790 Diverticulosis of intestine, part unspecified, without perforation or abscess without bleeding: Secondary | ICD-10-CM | POA: Diagnosis not present

## 2020-02-26 DIAGNOSIS — S72302A Unspecified fracture of shaft of left femur, initial encounter for closed fracture: Secondary | ICD-10-CM | POA: Diagnosis not present

## 2020-02-26 DIAGNOSIS — F0390 Unspecified dementia without behavioral disturbance: Secondary | ICD-10-CM | POA: Diagnosis not present

## 2020-02-26 DIAGNOSIS — M069 Rheumatoid arthritis, unspecified: Secondary | ICD-10-CM | POA: Diagnosis not present

## 2020-02-26 DIAGNOSIS — Z79899 Other long term (current) drug therapy: Secondary | ICD-10-CM | POA: Diagnosis not present

## 2020-02-26 DIAGNOSIS — F0391 Unspecified dementia with behavioral disturbance: Secondary | ICD-10-CM | POA: Diagnosis not present

## 2020-02-26 DIAGNOSIS — J45909 Unspecified asthma, uncomplicated: Secondary | ICD-10-CM | POA: Diagnosis not present

## 2020-02-26 DIAGNOSIS — M25552 Pain in left hip: Secondary | ICD-10-CM | POA: Diagnosis not present

## 2020-02-26 DIAGNOSIS — Z4789 Encounter for other orthopedic aftercare: Secondary | ICD-10-CM | POA: Diagnosis not present

## 2020-02-26 DIAGNOSIS — M79672 Pain in left foot: Secondary | ICD-10-CM | POA: Diagnosis not present

## 2020-02-26 DIAGNOSIS — J449 Chronic obstructive pulmonary disease, unspecified: Secondary | ICD-10-CM | POA: Diagnosis not present

## 2020-02-26 DIAGNOSIS — R5381 Other malaise: Secondary | ICD-10-CM | POA: Diagnosis not present

## 2020-02-26 DIAGNOSIS — M79652 Pain in left thigh: Secondary | ICD-10-CM | POA: Diagnosis not present

## 2020-02-26 DIAGNOSIS — M2550 Pain in unspecified joint: Secondary | ICD-10-CM | POA: Diagnosis not present

## 2020-02-26 DIAGNOSIS — M5430 Sciatica, unspecified side: Secondary | ICD-10-CM | POA: Diagnosis not present

## 2020-02-26 DIAGNOSIS — R4181 Age-related cognitive decline: Secondary | ICD-10-CM | POA: Diagnosis not present

## 2020-02-26 DIAGNOSIS — Z7401 Bed confinement status: Secondary | ICD-10-CM | POA: Diagnosis not present

## 2020-02-26 DIAGNOSIS — W19XXXD Unspecified fall, subsequent encounter: Secondary | ICD-10-CM | POA: Diagnosis not present

## 2020-02-26 DIAGNOSIS — S72142A Displaced intertrochanteric fracture of left femur, initial encounter for closed fracture: Secondary | ICD-10-CM | POA: Diagnosis not present

## 2020-02-26 DIAGNOSIS — Z4889 Encounter for other specified surgical aftercare: Secondary | ICD-10-CM | POA: Diagnosis not present

## 2020-02-26 DIAGNOSIS — S72142D Displaced intertrochanteric fracture of left femur, subsequent encounter for closed fracture with routine healing: Secondary | ICD-10-CM | POA: Diagnosis not present

## 2020-02-26 DIAGNOSIS — K219 Gastro-esophageal reflux disease without esophagitis: Secondary | ICD-10-CM | POA: Diagnosis not present

## 2020-02-26 LAB — CBC
HCT: 31.2 % — ABNORMAL LOW (ref 36.0–46.0)
Hemoglobin: 10.7 g/dL — ABNORMAL LOW (ref 12.0–15.0)
MCH: 33.4 pg (ref 26.0–34.0)
MCHC: 34.3 g/dL (ref 30.0–36.0)
MCV: 97.5 fL (ref 80.0–100.0)
Platelets: 177 10*3/uL (ref 150–400)
RBC: 3.2 MIL/uL — ABNORMAL LOW (ref 3.87–5.11)
RDW: 13 % (ref 11.5–15.5)
WBC: 9 10*3/uL (ref 4.0–10.5)
nRBC: 0 % (ref 0.0–0.2)

## 2020-02-26 LAB — BASIC METABOLIC PANEL
Anion gap: 5 (ref 5–15)
BUN: 8 mg/dL (ref 8–23)
CO2: 28 mmol/L (ref 22–32)
Calcium: 8.2 mg/dL — ABNORMAL LOW (ref 8.9–10.3)
Chloride: 101 mmol/L (ref 98–111)
Creatinine, Ser: 0.51 mg/dL (ref 0.44–1.00)
GFR, Estimated: >60 mL/min (ref >60)
Glucose, Bld: 131 mg/dL — ABNORMAL HIGH (ref 70–99)
Potassium: 5.1 mmol/L (ref 3.5–5.1)
Sodium: 134 mmol/L — ABNORMAL LOW (ref 135–145)

## 2020-02-26 LAB — SARS CORONAVIRUS 2 (TAT 6-24 HRS): SARS Coronavirus 2: NEGATIVE

## 2020-02-26 MED ORDER — DOCUSATE SODIUM 100 MG PO CAPS: 100.0000 mg | ORAL_CAPSULE | Freq: Two times a day (BID) | ORAL | 0 refills | Status: AC

## 2020-02-26 NOTE — Progress Notes (Signed)
Physical Therapy Treatment Patient Details Name: Cathy Martinez MRN: [id number] DOB: 1931/08/08 Today's Date: 02/26/2020    History of Present Illness Pt underwent LT femur IM fixation on 02/24/20 following a fall at home. PMH: COPD, RT TKA 2015, RA. Son reports h/o dementia.    PT Comments    Pt continues to require significant time to complete tasks due to high pain and constant encouragement/cues to soothe anxiety and safety with mobility. Pt very resistant to mobilizing with therapist, repeats "let me do it by myself" repeatedly while therapist gently assists pt through mobility, pausing when pt requests for rest breaks. Pt takes ~20 minutes for sit to supine and ~20 minutes for squat pivot to chair and to scoot back in chair with leg rest elevated. Pt's son in room assisting to soothe and encourage pt throughout session. Once pt in chair, RN administered pain medication and MD into room to see pt. Continue to recommend SNF upon d/c.  Follow Up Recommendations  SNF     Equipment Recommendations  Rolling walker with 5" wheels;3in1 (PT)    Recommendations for Other Services       Precautions / Restrictions Precautions Precautions: Fall Restrictions Weight Bearing Restrictions: Yes LLE Weight Bearing: Partial weight bearing LLE Partial Weight Bearing Percentage or Pounds: 50%    Mobility  Bed Mobility Overal bed mobility: Needs Assistance Bed Mobility: Supine to Sit  Supine to sit: Max assist;HOB elevated  General bed mobility comments: pt slides BLE ~2 inches at a time over to EOB, constant education and encouragement with mobility with poor carryover, takes ~20 minutes to go supine to sit  Transfers Overall transfer level: Needs assistance Equipment used: None Transfers: Squat Pivot Transfers  Squat pivot transfers: Max assist     General transfer comment: therapist positioned anterior to pt with pt holding onto pt's arms, max A to squat pivot over to chair, constant  education on WB statud and hand placement and sequencing to reduce pt's anxiety with poor carryover, takes ~5 minutes to complete transfer and ~15 minutes to scoot back in chair and raise footrest  Ambulation/Gait  General Gait Details: unable   Stairs             Wheelchair Mobility    Modified Rankin (Stroke Patients Only)       Balance Overall balance assessment: History of Falls;Needs assistance Sitting-balance support: Single extremity supported Sitting balance-Leahy Scale: Poor Sitting balance - Comments: pt leaning on R forearm avoiding LLE   Standing balance support: Bilateral upper extremity supported Standing balance-Leahy Scale: Poor Standing balance comment: max A with BUE support       Cognition Arousal/Alertness: Awake/alert Behavior During Therapy: Anxious Overall Cognitive Status: History of cognitive impairments - at baseline  General Comments: Pt tearful occasionally during session, repeats "don't jerk me" and "let me do it by myself" despite constant education from therapist and pt's son      Exercises General Exercises - Lower Extremity Ankle Circles/Pumps: Supine;AROM;Strengthening;Both;10 reps Quad Sets: Supine;AROM;Strengthening;Both;10 reps Gluteal Sets: Supine;AROM;Strengthening;Both;10 reps    General Comments        Pertinent Vitals/Pain Pain Assessment: Faces Pain Score: 5  Pain Location: L hip, increased pain when mobilizing not quantified Pain Descriptors / Indicators: Grimacing;Guarding;Moaning;Throbbing;Sore Pain Intervention(s): Limited activity within patient's tolerance;Monitored during session;Premedicated before session;Repositioned;Ice applied    Home Living                      Prior Function  PT Goals (current goals can now be found in the care plan section) Acute Rehab PT Goals Patient Stated Goal: Per son, for pt to go home if possible, but open to rehab possibilities. PT Goal Formulation:  With family Time For Goal Achievement: 03/10/20 Potential to Achieve Goals: Good Progress towards PT goals: Progressing toward goals    Frequency    Min 2X/week      PT Plan Current plan remains appropriate    Co-evaluation              AM-PAC PT "6 Clicks" Mobility   Outcome Measure  Help needed turning from your back to your side while in a flat bed without using bedrails?: Total Help needed moving from lying on your back to sitting on the side of a flat bed without using bedrails?: Total Help needed moving to and from a bed to a chair (including a wheelchair)?: Total Help needed standing up from a chair using your arms (e.g., wheelchair or bedside chair)?: Total Help needed to walk in hospital room?: Total Help needed climbing 3-5 steps with a railing? : Total 6 Click Score: 6    End of Session Equipment Utilized During Treatment: Gait belt Activity Tolerance: Patient limited by pain Patient left: in chair;with call bell/phone within reach;with chair alarm set;with nursing/sitter in room;with family/visitor present (MD in room) Nurse Communication: Mobility status;Patient requests pain meds PT Visit Diagnosis: Unsteadiness on feet (R26.81);Other abnormalities of gait and mobility (R26.89);Muscle weakness (generalized) (M62.81);Pain Pain - Right/Left: Left Pain - part of body: Hip     Time: 1007-1105 PT Time Calculation (min) (ACUTE ONLY): 58 min  Charges:  $Therapeutic Exercise: 8-22 mins $Therapeutic Activity: 38-52 mins                      Tori Danalee Flath PT, DPT 02/26/20, 1:38 PM

## 2020-02-26 NOTE — Plan of Care (Signed)

## 2020-02-26 NOTE — Progress Notes (Signed)
Patient report was called to April, RN at [facility]. Pt was transported to facility via [facility]. DC instructions were sent to facility via [facility].

## 2020-02-26 NOTE — Plan of Care (Signed)

## 2020-02-26 NOTE — Discharge Summary (Signed)
Physician Discharge Summary  Cathy Martinez YQM:[id number] DOB: October 06, 1931 DOA: 02/23/2020  PCP: Tamsen Roers, MD  Admit date: 02/23/2020 Discharge date: 02/26/2020  Time spent: 50 minutes  Recommendations for Outpatient Follow-up:  1. Follow-up orthopedics in 2 weeks 2. Check BMP in 3 days, potassium was 5.1 on 02/26/2020   Discharge Diagnoses:  Principal Problem:   Closed left femoral fracture (HCC) Active Problems:   GERD (gastroesophageal reflux disease)   Asthma   Rheumatoid arthritis ([place])   Fall at home, initial encounter   Dementia [facility])   Discharge Condition: Stable  Diet recommendation: Heart healthy diet  Filed Weights   02/24/20 0542  Weight: 45.5 kg    History of present illness:  87 year old female with history of mild dementia, GERD, mild COPD, remote arthritis, sciatica, asthma, diverticular disease who lives alone apparently sustained a mechanical fall yesterday when she slipped and fell.  She complained of pain in the left side of hip.  Work-up showed displaced femoral neck fracture on the left.  Orthopedics was consulted.  COVID-19 screen was negative.   Hospital Course:  1. *Displaced left femoral neck fracture-status post intramedullary fixation.  Orthopedic surgery has cleared patient for discharge.  Recommend 50% weight bearing RLE with walker 2. Dementia-at baseline 3. GERD-continue PPI. 4. Mechanical fall-PT/OT at skilled facility 5. History of asthma-continue as needed albuterol 6. Hyperkalemia-potassium is 5.1 today.  Recommend rechecking BMP in 3 days.   Procedures:  Status post intramedullary fixation  Consultations:  Orthopedics  Discharge Exam: Vitals:   02/25/20 2140 02/26/20 0551  BP: (!) 135/52 (!) 143/52  Pulse: 73 77  Resp: 18 18  Temp: (!) 97.5 F (36.4 C) 97.7 F (36.5 C)  SpO2: 97% 100%    General: *Appears in no acute distress Cardiovascular: S1-S2, regular Respiratory: Clear to auscultation  bilaterally  Discharge Instructions   Discharge Instructions    Diet - low sodium heart healthy   Complete by: As directed    Increase activity slowly   Complete by: As directed    No wound care   Complete by: As directed      Allergies as of 02/26/2020      Reactions   Codeine Other (See Comments)   unk   Penicillins Other (See Comments)   unk      Medication List    TAKE these medications   albuterol 108 (90 Base) MCG/ACT inhaler Commonly known as: VENTOLIN HFA Inhale 1 puff into the lungs every 6 (six) hours as needed for wheezing or shortness of breath.   aspirin 81 MG chewable tablet Commonly known as: Aspirin Childrens Chew 1 tablet (81 mg total) by mouth daily.   carboxymethylcellulose 0.5 % Soln Commonly known as: REFRESH PLUS Place 1 drop into both eyes 3 (three) times daily as needed (eye irritation).   docusate sodium 100 MG capsule Commonly known as: COLACE Take 1 capsule (100 mg total) by mouth 2 (two) times daily.   HYDROcodone-acetaminophen 7.5-325 MG tablet Commonly known as: NORCO Take 1-2 tablets by mouth every 4 (four) hours as needed for severe pain (pain score 7-10).   Krill Oil 300 MG Caps Take 1 capsule by mouth daily.   omeprazole 20 MG tablet Commonly known as: PRILOSEC OTC Take 20 mg by mouth as needed (heart burn).   psyllium 58.6 % powder Commonly known as: METAMUCIL Take 1 packet by mouth daily.   tiZANidine 4 MG tablet Commonly known as: ZANAFLEX Take 1 tablet (4 mg total) by mouth every  6 (six) hours as needed for muscle spasms.   vitamin C 500 MG tablet Commonly known as: ASCORBIC ACID Take 500 mg by mouth daily.      Allergies  Allergen Reactions  . Codeine Other (See Comments)    unk  . Penicillins Other (See Comments)    unk    Follow-up Information    Swinteck, Aaron Edelman, MD. Schedule an appointment as soon as possible for a visit in 2 weeks.   Specialty: Orthopedic Surgery Why: For wound re-check Contact  information: [address] [place] [address] [phone]                The results of significant diagnostics from this hospitalization (including imaging, microbiology, ancillary and laboratory) are listed below for reference.    Significant Diagnostic Studies: Pelvis Portable  Result Date: 02/24/2020 CLINICAL DATA:  Left hip fracture repair EXAM: PORTABLE PELVIS 1-2 VIEWS COMPARISON:  02/23/2020 FINDINGS: Two frontal views of the pelvis are obtained. The iliac crests are excluded by collimation. Intramedullary rod with proximal dynamic screw and distal interlocking screw traverse the inter trochanteric left hip fracture seen previously. Alignment is near anatomic. Postsurgical changes are seen in the soft tissues. Utilized portions of the right femur and remainder of the bony pelvis are unremarkable. IMPRESSION: 1. ORIF left hip fracture, with near anatomic alignment. Electronically Signed   By: Randa Ngo M.D.   On: 02/24/2020 20:17   DG Chest Port 1 View  Result Date: 02/23/2020 CLINICAL DATA:  Preoperative assessment for hip fracture EXAM: PORTABLE CHEST 1 VIEW COMPARISON:  02/01/2013 FINDINGS: Single frontal view of the chest demonstrates an unremarkable cardiac silhouette. No airspace disease, effusion, or pneumothorax. No acute bony abnormalities. IMPRESSION: 1. No acute intrathoracic process. Electronically Signed   By: Randa Ngo M.D.   On: 02/23/2020 22:31   DG Knee Left Port  Result Date: 02/23/2020 CLINICAL DATA:  Status post fall. EXAM: PORTABLE LEFT KNEE - 1-2 VIEW COMPARISON:  None. FINDINGS: No evidence of an acute fracture, dislocation or joint effusion. There is moderate severity narrowing of the medial tibiofemoral compartment space. Soft tissue structures are unremarkable. IMPRESSION: 1. Degenerative changes without evidence of an acute fracture or dislocation. Electronically Signed   By: Virgina Norfolk M.D.   On: 02/23/2020 23:21   DG  C-Arm 1-60 Min-No Report  Result Date: 02/24/2020 CLINICAL DATA:  ORIF left femur fracture EXAM: LEFT FEMUR 2 VIEWS; DG C-ARM 1-60 MIN-NO REPORT COMPARISON:  02/23/2020 FINDINGS: Subtrochanteric fracture repaired with a compression screw into the femoral head and locking intramedullary rod extending to the distal femur. Satisfactory fracture alignment. Satisfactory hardware placement. IMPRESSION: Rod fixation of subtrochanteric fracture in satisfactory alignment. Electronically Signed   By: Franchot Gallo M.D.   On: 02/24/2020 18:56   DG Hip Unilat With Pelvis 2-3 Views Left  Result Date: 02/23/2020 CLINICAL DATA:  87 year old female with fall. EXAM: DG HIP (WITH OR WITHOUT PELVIS) 2-3V LEFT COMPARISON:  Pelvic radiograph dated 06/21/2003. FINDINGS: There is a displaced and angulated fracture of the left femoral neck with trochanteric and subtrochanteric extension. There is varus angulation. No dislocation. Mild osteopenia. The soft tissues are unremarkable. IMPRESSION: Displaced and angulated left femoral neck fracture. Electronically Signed   By: Anner Crete M.D.   On: 02/23/2020 22:19   DG FEMUR MIN 2 VIEWS LEFT  Result Date: 02/24/2020 CLINICAL DATA:  ORIF left femur fracture EXAM: LEFT FEMUR 2 VIEWS; DG C-ARM 1-60 MIN-NO REPORT COMPARISON:  02/23/2020 FINDINGS: Subtrochanteric fracture  repaired with a compression screw into the femoral head and locking intramedullary rod extending to the distal femur. Satisfactory fracture alignment. Satisfactory hardware placement. IMPRESSION: Rod fixation of subtrochanteric fracture in satisfactory alignment. Electronically Signed   By: Franchot Gallo M.D.   On: 02/24/2020 18:56   DG FEMUR PORT MIN 2 VIEWS LEFT  Result Date: 02/24/2020 CLINICAL DATA:  Left intramedullary nail placement EXAM: LEFT FEMUR PORTABLE 2 VIEWS COMPARISON:  02/23/2020 FINDINGS: Frontal and cross-table lateral views of the left femur are obtained. Intramedullary rod with proximal dynamic  screw and distal interlocking screw traverse the comminuted intertrochanteric left hip fracture seen previously. Alignment is near anatomic. Postsurgical changes are seen in the soft tissues. IMPRESSION: 1. ORIF of a left hip fracture, with near anatomic alignment. Electronically Signed   By: Randa Ngo M.D.   On: 02/24/2020 20:16    Microbiology: Recent Results (from the past 240 hour(s))  SARS CORONAVIRUS 2 (TAT 6-24 HRS) Nasopharyngeal Nasopharyngeal Swab     Status: None   Collection Time: 02/23/20 10:05 PM   Specimen: Nasopharyngeal Swab  Result Value Ref Range Status   SARS Coronavirus 2 NEGATIVE NEGATIVE Final    Comment: (NOTE) SARS-CoV-2 target nucleic acids are NOT DETECTED.  The SARS-CoV-2 RNA is generally detectable in upper and lower respiratory specimens during the acute phase of infection. Negative results do not preclude SARS-CoV-2 infection, do not rule out co-infections with other pathogens, and should not be used as the sole basis for treatment or other patient management decisions. Negative results must be combined with clinical observations, patient history, and epidemiological information. The expected result is Negative.  Fact Sheet for Patients: SugarRoll.be  Fact Sheet for Healthcare Providers: https://www.woods-mathews.com/  This test is not yet approved or cleared by the [place] FDA and  has been authorized for detection and/or diagnosis of SARS-CoV-2 by FDA under an Emergency Use Authorization (EUA). This EUA will remain  in effect (meaning this test can be used) for the duration of the COVID-19 declaration under Se ction 564(b)(1) of the Act, 21 U.S.C. section 360bbb-3(b)(1), unless the authorization is terminated or revoked sooner.  Performed at [facility], [place] [address], [place], [place] [address]   Surgical PCR screen     Status: None   Collection Time: 02/24/20  5:59 AM   Specimen:  Nasal Mucosa; Nasal Swab  Result Value Ref Range Status   MRSA, PCR NEGATIVE NEGATIVE Final   Staphylococcus aureus NEGATIVE NEGATIVE Final    Comment: (NOTE) The Xpert SA Assay (FDA approved for NASAL specimens in patients 37 years of age and older), is one component of a comprehensive surveillance program. It is not intended to diagnose infection nor to guide or monitor treatment. Performed at [facility], [place] [address], [place], [place] [address]   SARS CORONAVIRUS 2 (TAT 6-24 HRS) Nasopharyngeal Nasopharyngeal Swab     Status: None   Collection Time: 02/25/20  5:56 PM   Specimen: Nasopharyngeal Swab  Result Value Ref Range Status   SARS Coronavirus 2 NEGATIVE NEGATIVE Final    Comment: (NOTE) SARS-CoV-2 target nucleic acids are NOT DETECTED.  The SARS-CoV-2 RNA is generally detectable in upper and lower respiratory specimens during the acute phase of infection. Negative results do not preclude SARS-CoV-2 infection, do not rule out co-infections with other pathogens, and should not be used as the sole basis for treatment or other patient management decisions. Negative results must be combined with clinical observations, patient history, and epidemiological information.  The expected result is Negative.  Fact Sheet for Patients: SugarRoll.be  Fact Sheet for Healthcare Providers: https://www.woods-mathews.com/  This test is not yet approved or cleared by the [place] FDA and  has been authorized for detection and/or diagnosis of SARS-CoV-2 by FDA under an Emergency Use Authorization (EUA). This EUA will remain  in effect (meaning this test can be used) for the duration of the COVID-19 declaration under Se ction 564(b)(1) of the Act, 21 U.S.C. section 360bbb-3(b)(1), unless the authorization is terminated or revoked sooner.  Performed at [facility], [place] [address] [place], [place] [address]       Labs: Basic Metabolic Panel: Recent Labs  Lab 02/23/20 2148 02/24/20 0500 02/25/20 0616 02/26/20 0546  NA 138 137 131* 134*  K 3.9 4.2 4.0 5.1  CL 103 103 100 101  CO2 24 24 25 28   GLUCOSE 161* 233* 148* 131*  BUN 16 16 12 8   CREATININE 0.67 0.59 0.63 0.51  CALCIUM 8.8* 8.7* 7.5* 8.2*   Liver Function Tests: Recent Labs  Lab 02/24/20 0500  AST 18  ALT 14  ALKPHOS 46  BILITOT 0.9  PROT 6.2*  ALBUMIN 3.5   No results for input(s): LIPASE, AMYLASE in the last 168 hours. No results for input(s): AMMONIA in the last 168 hours. CBC: Recent Labs  Lab 02/23/20 2148 02/24/20 0500 02/25/20 0616 02/26/20 0546  WBC 12.3* 10.8* 8.6 9.0  NEUTROABS 9.6*  --   --   --   HGB 14.9 13.5 10.0* 10.7*  HCT 44.9 40.0 30.3* 31.2*  MCV 99.6 97.6 99.0 97.5  PLT 223 190 162 177       Signed:  Oswald Hillock MD.  [facility] 02/26/2020, 11:07 AM

## 2020-02-26 NOTE — TOC Transition Note (Signed)
Transition of Care [facility]) - CM/SW Discharge Note   Patient Details  Name: Cathy Martinez MRN: [id number] Date of Birth: March 10, 1931  [facility] CM/SW Contact:  Lia Hopping, [place] Phone Number: 02/26/2020, 11:33 AM   Clinical Narrative:    [facility] ready to accept the patient.  [facility] arranged to transport the patient at 1:00pm Nurse call report to: Crystal notified at bedside.      Final next level of care: Skilled Nursing Facility Barriers to Discharge: Barriers Resolved   Patient Goals and CMS Choice Patient states their goals for this hospitalization and ongoing recovery are:: Per son, rehab at [facility] CMS Medicare.gov Compare Post Acute Care list provided to::  (Son-Mike) Choice offered to / list presented to : Adult Children  Discharge Placement   Existing PASRR number confirmed : 02/25/20          Patient chooses bed at: [facility] Patient to be transferred to facility by: [facility] approved. Name of family member notified: Roel Cluck at bedside Patient and family notified of of transfer: 02/26/20  Discharge Plan and Services In-house Referral: Clinical Social Work Discharge Planning Services: CM Consult Post Acute Care Choice: [place]                               Social Determinants of Health (SDOH) Interventions     Readmission Risk Interventions No flowsheet data found.

## 2020-02-27 DIAGNOSIS — F0391 Unspecified dementia with behavioral disturbance: Secondary | ICD-10-CM | POA: Diagnosis not present

## 2020-02-27 DIAGNOSIS — M069 Rheumatoid arthritis, unspecified: Secondary | ICD-10-CM | POA: Diagnosis not present

## 2020-02-27 DIAGNOSIS — S72302A Unspecified fracture of shaft of left femur, initial encounter for closed fracture: Secondary | ICD-10-CM | POA: Diagnosis not present

## 2020-02-27 DIAGNOSIS — J45909 Unspecified asthma, uncomplicated: Secondary | ICD-10-CM | POA: Diagnosis not present

## 2020-02-29 DIAGNOSIS — Z79899 Other long term (current) drug therapy: Secondary | ICD-10-CM | POA: Diagnosis not present

## 2020-03-08 DIAGNOSIS — W19XXXD Unspecified fall, subsequent encounter: Secondary | ICD-10-CM | POA: Diagnosis not present

## 2020-03-08 DIAGNOSIS — M069 Rheumatoid arthritis, unspecified: Secondary | ICD-10-CM | POA: Diagnosis not present

## 2020-03-08 DIAGNOSIS — S72142D Displaced intertrochanteric fracture of left femur, subsequent encounter for closed fracture with routine healing: Secondary | ICD-10-CM | POA: Diagnosis not present

## 2020-03-08 DIAGNOSIS — Z79899 Other long term (current) drug therapy: Secondary | ICD-10-CM | POA: Diagnosis not present

## 2020-03-10 DIAGNOSIS — R52 Pain, unspecified: Secondary | ICD-10-CM | POA: Diagnosis not present

## 2020-03-25 DIAGNOSIS — M5430 Sciatica, unspecified side: Secondary | ICD-10-CM | POA: Diagnosis not present

## 2020-03-25 DIAGNOSIS — D649 Anemia, unspecified: Secondary | ICD-10-CM | POA: Diagnosis not present

## 2020-03-25 DIAGNOSIS — S72142D Displaced intertrochanteric fracture of left femur, subsequent encounter for closed fracture with routine healing: Secondary | ICD-10-CM | POA: Diagnosis not present

## 2020-03-25 DIAGNOSIS — J449 Chronic obstructive pulmonary disease, unspecified: Secondary | ICD-10-CM | POA: Diagnosis not present

## 2020-03-25 DIAGNOSIS — S7222XD Displaced subtrochanteric fracture of left femur, subsequent encounter for closed fracture with routine healing: Secondary | ICD-10-CM | POA: Diagnosis not present

## 2020-03-28 DIAGNOSIS — S72142D Displaced intertrochanteric fracture of left femur, subsequent encounter for closed fracture with routine healing: Secondary | ICD-10-CM | POA: Diagnosis not present

## 2020-03-28 DIAGNOSIS — D649 Anemia, unspecified: Secondary | ICD-10-CM | POA: Diagnosis not present

## 2020-03-28 DIAGNOSIS — S7222XD Displaced subtrochanteric fracture of left femur, subsequent encounter for closed fracture with routine healing: Secondary | ICD-10-CM | POA: Diagnosis not present

## 2020-03-28 DIAGNOSIS — J449 Chronic obstructive pulmonary disease, unspecified: Secondary | ICD-10-CM | POA: Diagnosis not present

## 2020-03-28 DIAGNOSIS — M5430 Sciatica, unspecified side: Secondary | ICD-10-CM | POA: Diagnosis not present

## 2020-03-29 DIAGNOSIS — S72142D Displaced intertrochanteric fracture of left femur, subsequent encounter for closed fracture with routine healing: Secondary | ICD-10-CM | POA: Diagnosis not present

## 2020-03-29 DIAGNOSIS — J449 Chronic obstructive pulmonary disease, unspecified: Secondary | ICD-10-CM | POA: Diagnosis not present

## 2020-03-29 DIAGNOSIS — S7222XD Displaced subtrochanteric fracture of left femur, subsequent encounter for closed fracture with routine healing: Secondary | ICD-10-CM | POA: Diagnosis not present

## 2020-03-29 DIAGNOSIS — M5430 Sciatica, unspecified side: Secondary | ICD-10-CM | POA: Diagnosis not present

## 2020-03-29 DIAGNOSIS — D649 Anemia, unspecified: Secondary | ICD-10-CM | POA: Diagnosis not present

## 2020-03-31 DIAGNOSIS — J449 Chronic obstructive pulmonary disease, unspecified: Secondary | ICD-10-CM | POA: Diagnosis not present

## 2020-03-31 DIAGNOSIS — D649 Anemia, unspecified: Secondary | ICD-10-CM | POA: Diagnosis not present

## 2020-03-31 DIAGNOSIS — S72142D Displaced intertrochanteric fracture of left femur, subsequent encounter for closed fracture with routine healing: Secondary | ICD-10-CM | POA: Diagnosis not present

## 2020-03-31 DIAGNOSIS — S7222XD Displaced subtrochanteric fracture of left femur, subsequent encounter for closed fracture with routine healing: Secondary | ICD-10-CM | POA: Diagnosis not present

## 2020-03-31 DIAGNOSIS — M5430 Sciatica, unspecified side: Secondary | ICD-10-CM | POA: Diagnosis not present

## 2020-04-01 DIAGNOSIS — D649 Anemia, unspecified: Secondary | ICD-10-CM | POA: Diagnosis not present

## 2020-04-01 DIAGNOSIS — S7222XD Displaced subtrochanteric fracture of left femur, subsequent encounter for closed fracture with routine healing: Secondary | ICD-10-CM | POA: Diagnosis not present

## 2020-04-01 DIAGNOSIS — J449 Chronic obstructive pulmonary disease, unspecified: Secondary | ICD-10-CM | POA: Diagnosis not present

## 2020-04-01 DIAGNOSIS — S72142D Displaced intertrochanteric fracture of left femur, subsequent encounter for closed fracture with routine healing: Secondary | ICD-10-CM | POA: Diagnosis not present

## 2020-04-01 DIAGNOSIS — M5430 Sciatica, unspecified side: Secondary | ICD-10-CM | POA: Diagnosis not present

## 2020-04-05 DIAGNOSIS — S72142D Displaced intertrochanteric fracture of left femur, subsequent encounter for closed fracture with routine healing: Secondary | ICD-10-CM | POA: Diagnosis not present

## 2020-04-05 DIAGNOSIS — D649 Anemia, unspecified: Secondary | ICD-10-CM | POA: Diagnosis not present

## 2020-04-05 DIAGNOSIS — J449 Chronic obstructive pulmonary disease, unspecified: Secondary | ICD-10-CM | POA: Diagnosis not present

## 2020-04-05 DIAGNOSIS — S7222XD Displaced subtrochanteric fracture of left femur, subsequent encounter for closed fracture with routine healing: Secondary | ICD-10-CM | POA: Diagnosis not present

## 2020-04-05 DIAGNOSIS — M5430 Sciatica, unspecified side: Secondary | ICD-10-CM | POA: Diagnosis not present

## 2020-04-06 DIAGNOSIS — M5430 Sciatica, unspecified side: Secondary | ICD-10-CM | POA: Diagnosis not present

## 2020-04-06 DIAGNOSIS — J449 Chronic obstructive pulmonary disease, unspecified: Secondary | ICD-10-CM | POA: Diagnosis not present

## 2020-04-06 DIAGNOSIS — S72142D Displaced intertrochanteric fracture of left femur, subsequent encounter for closed fracture with routine healing: Secondary | ICD-10-CM | POA: Diagnosis not present

## 2020-04-06 DIAGNOSIS — D649 Anemia, unspecified: Secondary | ICD-10-CM | POA: Diagnosis not present

## 2020-04-06 DIAGNOSIS — S7222XD Displaced subtrochanteric fracture of left femur, subsequent encounter for closed fracture with routine healing: Secondary | ICD-10-CM | POA: Diagnosis not present

## 2020-04-07 DIAGNOSIS — S72142D Displaced intertrochanteric fracture of left femur, subsequent encounter for closed fracture with routine healing: Secondary | ICD-10-CM | POA: Diagnosis not present

## 2020-04-07 DIAGNOSIS — S7222XD Displaced subtrochanteric fracture of left femur, subsequent encounter for closed fracture with routine healing: Secondary | ICD-10-CM | POA: Diagnosis not present

## 2020-04-07 DIAGNOSIS — M5430 Sciatica, unspecified side: Secondary | ICD-10-CM | POA: Diagnosis not present

## 2020-04-07 DIAGNOSIS — J449 Chronic obstructive pulmonary disease, unspecified: Secondary | ICD-10-CM | POA: Diagnosis not present

## 2020-04-07 DIAGNOSIS — D649 Anemia, unspecified: Secondary | ICD-10-CM | POA: Diagnosis not present

## 2020-04-11 DIAGNOSIS — S72142D Displaced intertrochanteric fracture of left femur, subsequent encounter for closed fracture with routine healing: Secondary | ICD-10-CM | POA: Diagnosis not present

## 2020-04-11 DIAGNOSIS — J449 Chronic obstructive pulmonary disease, unspecified: Secondary | ICD-10-CM | POA: Diagnosis not present

## 2020-04-11 DIAGNOSIS — D649 Anemia, unspecified: Secondary | ICD-10-CM | POA: Diagnosis not present

## 2020-04-11 DIAGNOSIS — M5430 Sciatica, unspecified side: Secondary | ICD-10-CM | POA: Diagnosis not present

## 2020-04-11 DIAGNOSIS — S7222XD Displaced subtrochanteric fracture of left femur, subsequent encounter for closed fracture with routine healing: Secondary | ICD-10-CM | POA: Diagnosis not present

## 2020-04-12 DIAGNOSIS — S7222XD Displaced subtrochanteric fracture of left femur, subsequent encounter for closed fracture with routine healing: Secondary | ICD-10-CM | POA: Diagnosis not present

## 2020-04-12 DIAGNOSIS — D649 Anemia, unspecified: Secondary | ICD-10-CM | POA: Diagnosis not present

## 2020-04-12 DIAGNOSIS — S72142D Displaced intertrochanteric fracture of left femur, subsequent encounter for closed fracture with routine healing: Secondary | ICD-10-CM | POA: Diagnosis not present

## 2020-04-12 DIAGNOSIS — J449 Chronic obstructive pulmonary disease, unspecified: Secondary | ICD-10-CM | POA: Diagnosis not present

## 2020-04-12 DIAGNOSIS — M5430 Sciatica, unspecified side: Secondary | ICD-10-CM | POA: Diagnosis not present

## 2020-04-14 DIAGNOSIS — S7222XD Displaced subtrochanteric fracture of left femur, subsequent encounter for closed fracture with routine healing: Secondary | ICD-10-CM | POA: Diagnosis not present

## 2020-04-14 DIAGNOSIS — M5430 Sciatica, unspecified side: Secondary | ICD-10-CM | POA: Diagnosis not present

## 2020-04-14 DIAGNOSIS — S72142D Displaced intertrochanteric fracture of left femur, subsequent encounter for closed fracture with routine healing: Secondary | ICD-10-CM | POA: Diagnosis not present

## 2020-04-14 DIAGNOSIS — D649 Anemia, unspecified: Secondary | ICD-10-CM | POA: Diagnosis not present

## 2020-04-14 DIAGNOSIS — J449 Chronic obstructive pulmonary disease, unspecified: Secondary | ICD-10-CM | POA: Diagnosis not present

## 2020-04-15 DIAGNOSIS — J449 Chronic obstructive pulmonary disease, unspecified: Secondary | ICD-10-CM | POA: Diagnosis not present

## 2020-04-15 DIAGNOSIS — M5430 Sciatica, unspecified side: Secondary | ICD-10-CM | POA: Diagnosis not present

## 2020-04-15 DIAGNOSIS — S7222XD Displaced subtrochanteric fracture of left femur, subsequent encounter for closed fracture with routine healing: Secondary | ICD-10-CM | POA: Diagnosis not present

## 2020-04-15 DIAGNOSIS — D649 Anemia, unspecified: Secondary | ICD-10-CM | POA: Diagnosis not present

## 2020-04-15 DIAGNOSIS — S72142D Displaced intertrochanteric fracture of left femur, subsequent encounter for closed fracture with routine healing: Secondary | ICD-10-CM | POA: Diagnosis not present

## 2020-04-18 DIAGNOSIS — M5430 Sciatica, unspecified side: Secondary | ICD-10-CM | POA: Diagnosis not present

## 2020-04-18 DIAGNOSIS — J449 Chronic obstructive pulmonary disease, unspecified: Secondary | ICD-10-CM | POA: Diagnosis not present

## 2020-04-18 DIAGNOSIS — S7222XD Displaced subtrochanteric fracture of left femur, subsequent encounter for closed fracture with routine healing: Secondary | ICD-10-CM | POA: Diagnosis not present

## 2020-04-18 DIAGNOSIS — S72142D Displaced intertrochanteric fracture of left femur, subsequent encounter for closed fracture with routine healing: Secondary | ICD-10-CM | POA: Diagnosis not present

## 2020-04-18 DIAGNOSIS — D649 Anemia, unspecified: Secondary | ICD-10-CM | POA: Diagnosis not present

## 2020-04-19 DIAGNOSIS — S72142D Displaced intertrochanteric fracture of left femur, subsequent encounter for closed fracture with routine healing: Secondary | ICD-10-CM | POA: Diagnosis not present

## 2020-04-19 DIAGNOSIS — S7222XD Displaced subtrochanteric fracture of left femur, subsequent encounter for closed fracture with routine healing: Secondary | ICD-10-CM | POA: Diagnosis not present

## 2020-04-19 DIAGNOSIS — J449 Chronic obstructive pulmonary disease, unspecified: Secondary | ICD-10-CM | POA: Diagnosis not present

## 2020-04-19 DIAGNOSIS — D649 Anemia, unspecified: Secondary | ICD-10-CM | POA: Diagnosis not present

## 2020-04-19 DIAGNOSIS — M5430 Sciatica, unspecified side: Secondary | ICD-10-CM | POA: Diagnosis not present

## 2020-04-21 DIAGNOSIS — M5430 Sciatica, unspecified side: Secondary | ICD-10-CM | POA: Diagnosis not present

## 2020-04-21 DIAGNOSIS — S72142D Displaced intertrochanteric fracture of left femur, subsequent encounter for closed fracture with routine healing: Secondary | ICD-10-CM | POA: Diagnosis not present

## 2020-04-21 DIAGNOSIS — J449 Chronic obstructive pulmonary disease, unspecified: Secondary | ICD-10-CM | POA: Diagnosis not present

## 2020-04-21 DIAGNOSIS — S7222XD Displaced subtrochanteric fracture of left femur, subsequent encounter for closed fracture with routine healing: Secondary | ICD-10-CM | POA: Diagnosis not present

## 2020-04-21 DIAGNOSIS — D649 Anemia, unspecified: Secondary | ICD-10-CM | POA: Diagnosis not present

## 2020-04-22 DIAGNOSIS — J449 Chronic obstructive pulmonary disease, unspecified: Secondary | ICD-10-CM | POA: Diagnosis not present

## 2020-04-22 DIAGNOSIS — S7222XD Displaced subtrochanteric fracture of left femur, subsequent encounter for closed fracture with routine healing: Secondary | ICD-10-CM | POA: Diagnosis not present

## 2020-04-22 DIAGNOSIS — D649 Anemia, unspecified: Secondary | ICD-10-CM | POA: Diagnosis not present

## 2020-04-22 DIAGNOSIS — M5430 Sciatica, unspecified side: Secondary | ICD-10-CM | POA: Diagnosis not present

## 2020-04-22 DIAGNOSIS — S72142D Displaced intertrochanteric fracture of left femur, subsequent encounter for closed fracture with routine healing: Secondary | ICD-10-CM | POA: Diagnosis not present

## 2020-04-25 DIAGNOSIS — J449 Chronic obstructive pulmonary disease, unspecified: Secondary | ICD-10-CM | POA: Diagnosis not present

## 2020-04-25 DIAGNOSIS — D649 Anemia, unspecified: Secondary | ICD-10-CM | POA: Diagnosis not present

## 2020-04-25 DIAGNOSIS — S72142D Displaced intertrochanteric fracture of left femur, subsequent encounter for closed fracture with routine healing: Secondary | ICD-10-CM | POA: Diagnosis not present

## 2020-04-25 DIAGNOSIS — S7222XD Displaced subtrochanteric fracture of left femur, subsequent encounter for closed fracture with routine healing: Secondary | ICD-10-CM | POA: Diagnosis not present

## 2020-04-25 DIAGNOSIS — M5430 Sciatica, unspecified side: Secondary | ICD-10-CM | POA: Diagnosis not present

## 2020-04-26 DIAGNOSIS — S7222XD Displaced subtrochanteric fracture of left femur, subsequent encounter for closed fracture with routine healing: Secondary | ICD-10-CM | POA: Diagnosis not present

## 2020-04-26 DIAGNOSIS — S72142D Displaced intertrochanteric fracture of left femur, subsequent encounter for closed fracture with routine healing: Secondary | ICD-10-CM | POA: Diagnosis not present

## 2020-04-26 DIAGNOSIS — M5430 Sciatica, unspecified side: Secondary | ICD-10-CM | POA: Diagnosis not present

## 2020-04-26 DIAGNOSIS — J449 Chronic obstructive pulmonary disease, unspecified: Secondary | ICD-10-CM | POA: Diagnosis not present

## 2020-04-26 DIAGNOSIS — D649 Anemia, unspecified: Secondary | ICD-10-CM | POA: Diagnosis not present

## 2020-04-28 DIAGNOSIS — J449 Chronic obstructive pulmonary disease, unspecified: Secondary | ICD-10-CM | POA: Diagnosis not present

## 2020-04-28 DIAGNOSIS — D649 Anemia, unspecified: Secondary | ICD-10-CM | POA: Diagnosis not present

## 2020-04-28 DIAGNOSIS — S7222XD Displaced subtrochanteric fracture of left femur, subsequent encounter for closed fracture with routine healing: Secondary | ICD-10-CM | POA: Diagnosis not present

## 2020-04-28 DIAGNOSIS — S72142D Displaced intertrochanteric fracture of left femur, subsequent encounter for closed fracture with routine healing: Secondary | ICD-10-CM | POA: Diagnosis not present

## 2020-04-28 DIAGNOSIS — M5430 Sciatica, unspecified side: Secondary | ICD-10-CM | POA: Diagnosis not present

## 2020-04-29 DIAGNOSIS — S72142D Displaced intertrochanteric fracture of left femur, subsequent encounter for closed fracture with routine healing: Secondary | ICD-10-CM | POA: Diagnosis not present

## 2020-04-29 DIAGNOSIS — J449 Chronic obstructive pulmonary disease, unspecified: Secondary | ICD-10-CM | POA: Diagnosis not present

## 2020-04-29 DIAGNOSIS — S7222XD Displaced subtrochanteric fracture of left femur, subsequent encounter for closed fracture with routine healing: Secondary | ICD-10-CM | POA: Diagnosis not present

## 2020-04-29 DIAGNOSIS — M5430 Sciatica, unspecified side: Secondary | ICD-10-CM | POA: Diagnosis not present

## 2020-04-29 DIAGNOSIS — D649 Anemia, unspecified: Secondary | ICD-10-CM | POA: Diagnosis not present

## 2020-05-02 DIAGNOSIS — S72142D Displaced intertrochanteric fracture of left femur, subsequent encounter for closed fracture with routine healing: Secondary | ICD-10-CM | POA: Diagnosis not present

## 2020-05-02 DIAGNOSIS — D649 Anemia, unspecified: Secondary | ICD-10-CM | POA: Diagnosis not present

## 2020-05-02 DIAGNOSIS — M5430 Sciatica, unspecified side: Secondary | ICD-10-CM | POA: Diagnosis not present

## 2020-05-02 DIAGNOSIS — S7222XD Displaced subtrochanteric fracture of left femur, subsequent encounter for closed fracture with routine healing: Secondary | ICD-10-CM | POA: Diagnosis not present

## 2020-05-02 DIAGNOSIS — J449 Chronic obstructive pulmonary disease, unspecified: Secondary | ICD-10-CM | POA: Diagnosis not present

## 2020-05-03 DIAGNOSIS — D649 Anemia, unspecified: Secondary | ICD-10-CM | POA: Diagnosis not present

## 2020-05-03 DIAGNOSIS — J449 Chronic obstructive pulmonary disease, unspecified: Secondary | ICD-10-CM | POA: Diagnosis not present

## 2020-05-03 DIAGNOSIS — S7222XD Displaced subtrochanteric fracture of left femur, subsequent encounter for closed fracture with routine healing: Secondary | ICD-10-CM | POA: Diagnosis not present

## 2020-05-03 DIAGNOSIS — S72142D Displaced intertrochanteric fracture of left femur, subsequent encounter for closed fracture with routine healing: Secondary | ICD-10-CM | POA: Diagnosis not present

## 2020-05-03 DIAGNOSIS — M5430 Sciatica, unspecified side: Secondary | ICD-10-CM | POA: Diagnosis not present

## 2020-05-04 DIAGNOSIS — D649 Anemia, unspecified: Secondary | ICD-10-CM | POA: Diagnosis not present

## 2020-05-04 DIAGNOSIS — J449 Chronic obstructive pulmonary disease, unspecified: Secondary | ICD-10-CM | POA: Diagnosis not present

## 2020-05-04 DIAGNOSIS — S7222XD Displaced subtrochanteric fracture of left femur, subsequent encounter for closed fracture with routine healing: Secondary | ICD-10-CM | POA: Diagnosis not present

## 2020-05-04 DIAGNOSIS — S72142D Displaced intertrochanteric fracture of left femur, subsequent encounter for closed fracture with routine healing: Secondary | ICD-10-CM | POA: Diagnosis not present

## 2020-05-04 DIAGNOSIS — M5430 Sciatica, unspecified side: Secondary | ICD-10-CM | POA: Diagnosis not present

## 2020-05-06 DIAGNOSIS — S7222XD Displaced subtrochanteric fracture of left femur, subsequent encounter for closed fracture with routine healing: Secondary | ICD-10-CM | POA: Diagnosis not present

## 2020-05-06 DIAGNOSIS — D649 Anemia, unspecified: Secondary | ICD-10-CM | POA: Diagnosis not present

## 2020-05-06 DIAGNOSIS — S72142D Displaced intertrochanteric fracture of left femur, subsequent encounter for closed fracture with routine healing: Secondary | ICD-10-CM | POA: Diagnosis not present

## 2020-05-06 DIAGNOSIS — J449 Chronic obstructive pulmonary disease, unspecified: Secondary | ICD-10-CM | POA: Diagnosis not present

## 2020-05-06 DIAGNOSIS — M5430 Sciatica, unspecified side: Secondary | ICD-10-CM | POA: Diagnosis not present

## 2020-05-09 DIAGNOSIS — J449 Chronic obstructive pulmonary disease, unspecified: Secondary | ICD-10-CM | POA: Diagnosis not present

## 2020-05-09 DIAGNOSIS — M5430 Sciatica, unspecified side: Secondary | ICD-10-CM | POA: Diagnosis not present

## 2020-05-09 DIAGNOSIS — S7222XD Displaced subtrochanteric fracture of left femur, subsequent encounter for closed fracture with routine healing: Secondary | ICD-10-CM | POA: Diagnosis not present

## 2020-05-09 DIAGNOSIS — D649 Anemia, unspecified: Secondary | ICD-10-CM | POA: Diagnosis not present

## 2020-05-09 DIAGNOSIS — S72142D Displaced intertrochanteric fracture of left femur, subsequent encounter for closed fracture with routine healing: Secondary | ICD-10-CM | POA: Diagnosis not present

## 2020-05-10 DIAGNOSIS — S7222XD Displaced subtrochanteric fracture of left femur, subsequent encounter for closed fracture with routine healing: Secondary | ICD-10-CM | POA: Diagnosis not present

## 2020-05-10 DIAGNOSIS — J449 Chronic obstructive pulmonary disease, unspecified: Secondary | ICD-10-CM | POA: Diagnosis not present

## 2020-05-10 DIAGNOSIS — S72142D Displaced intertrochanteric fracture of left femur, subsequent encounter for closed fracture with routine healing: Secondary | ICD-10-CM | POA: Diagnosis not present

## 2020-05-10 DIAGNOSIS — D649 Anemia, unspecified: Secondary | ICD-10-CM | POA: Diagnosis not present

## 2020-05-10 DIAGNOSIS — M5430 Sciatica, unspecified side: Secondary | ICD-10-CM | POA: Diagnosis not present

## 2020-05-12 DIAGNOSIS — J449 Chronic obstructive pulmonary disease, unspecified: Secondary | ICD-10-CM | POA: Diagnosis not present

## 2020-05-12 DIAGNOSIS — S72142D Displaced intertrochanteric fracture of left femur, subsequent encounter for closed fracture with routine healing: Secondary | ICD-10-CM | POA: Diagnosis not present

## 2020-05-12 DIAGNOSIS — M5430 Sciatica, unspecified side: Secondary | ICD-10-CM | POA: Diagnosis not present

## 2020-05-12 DIAGNOSIS — S7222XD Displaced subtrochanteric fracture of left femur, subsequent encounter for closed fracture with routine healing: Secondary | ICD-10-CM | POA: Diagnosis not present

## 2020-05-12 DIAGNOSIS — D649 Anemia, unspecified: Secondary | ICD-10-CM | POA: Diagnosis not present

## 2020-05-13 DIAGNOSIS — D649 Anemia, unspecified: Secondary | ICD-10-CM | POA: Diagnosis not present

## 2020-05-13 DIAGNOSIS — M5430 Sciatica, unspecified side: Secondary | ICD-10-CM | POA: Diagnosis not present

## 2020-05-13 DIAGNOSIS — S72142D Displaced intertrochanteric fracture of left femur, subsequent encounter for closed fracture with routine healing: Secondary | ICD-10-CM | POA: Diagnosis not present

## 2020-05-13 DIAGNOSIS — J449 Chronic obstructive pulmonary disease, unspecified: Secondary | ICD-10-CM | POA: Diagnosis not present

## 2020-05-13 DIAGNOSIS — S7222XD Displaced subtrochanteric fracture of left femur, subsequent encounter for closed fracture with routine healing: Secondary | ICD-10-CM | POA: Diagnosis not present

## 2020-05-16 DIAGNOSIS — D649 Anemia, unspecified: Secondary | ICD-10-CM | POA: Diagnosis not present

## 2020-05-16 DIAGNOSIS — S72142D Displaced intertrochanteric fracture of left femur, subsequent encounter for closed fracture with routine healing: Secondary | ICD-10-CM | POA: Diagnosis not present

## 2020-05-16 DIAGNOSIS — S7222XD Displaced subtrochanteric fracture of left femur, subsequent encounter for closed fracture with routine healing: Secondary | ICD-10-CM | POA: Diagnosis not present

## 2020-05-16 DIAGNOSIS — M5430 Sciatica, unspecified side: Secondary | ICD-10-CM | POA: Diagnosis not present

## 2020-05-16 DIAGNOSIS — J449 Chronic obstructive pulmonary disease, unspecified: Secondary | ICD-10-CM | POA: Diagnosis not present

## 2020-05-17 DIAGNOSIS — S7222XD Displaced subtrochanteric fracture of left femur, subsequent encounter for closed fracture with routine healing: Secondary | ICD-10-CM | POA: Diagnosis not present

## 2020-05-17 DIAGNOSIS — D649 Anemia, unspecified: Secondary | ICD-10-CM | POA: Diagnosis not present

## 2020-05-17 DIAGNOSIS — J449 Chronic obstructive pulmonary disease, unspecified: Secondary | ICD-10-CM | POA: Diagnosis not present

## 2020-05-17 DIAGNOSIS — M5430 Sciatica, unspecified side: Secondary | ICD-10-CM | POA: Diagnosis not present

## 2020-05-17 DIAGNOSIS — S72142D Displaced intertrochanteric fracture of left femur, subsequent encounter for closed fracture with routine healing: Secondary | ICD-10-CM | POA: Diagnosis not present

## 2020-05-19 DIAGNOSIS — D649 Anemia, unspecified: Secondary | ICD-10-CM | POA: Diagnosis not present

## 2020-05-19 DIAGNOSIS — M5430 Sciatica, unspecified side: Secondary | ICD-10-CM | POA: Diagnosis not present

## 2020-05-19 DIAGNOSIS — S7222XD Displaced subtrochanteric fracture of left femur, subsequent encounter for closed fracture with routine healing: Secondary | ICD-10-CM | POA: Diagnosis not present

## 2020-05-19 DIAGNOSIS — S72142D Displaced intertrochanteric fracture of left femur, subsequent encounter for closed fracture with routine healing: Secondary | ICD-10-CM | POA: Diagnosis not present

## 2020-05-19 DIAGNOSIS — J449 Chronic obstructive pulmonary disease, unspecified: Secondary | ICD-10-CM | POA: Diagnosis not present

## 2020-05-20 DIAGNOSIS — S72142D Displaced intertrochanteric fracture of left femur, subsequent encounter for closed fracture with routine healing: Secondary | ICD-10-CM | POA: Diagnosis not present

## 2020-05-20 DIAGNOSIS — D649 Anemia, unspecified: Secondary | ICD-10-CM | POA: Diagnosis not present

## 2020-05-20 DIAGNOSIS — S7222XD Displaced subtrochanteric fracture of left femur, subsequent encounter for closed fracture with routine healing: Secondary | ICD-10-CM | POA: Diagnosis not present

## 2020-05-20 DIAGNOSIS — M5430 Sciatica, unspecified side: Secondary | ICD-10-CM | POA: Diagnosis not present

## 2020-05-20 DIAGNOSIS — J449 Chronic obstructive pulmonary disease, unspecified: Secondary | ICD-10-CM | POA: Diagnosis not present

## 2020-05-23 DIAGNOSIS — J449 Chronic obstructive pulmonary disease, unspecified: Secondary | ICD-10-CM | POA: Diagnosis not present

## 2020-05-23 DIAGNOSIS — S7222XD Displaced subtrochanteric fracture of left femur, subsequent encounter for closed fracture with routine healing: Secondary | ICD-10-CM | POA: Diagnosis not present

## 2020-05-23 DIAGNOSIS — D649 Anemia, unspecified: Secondary | ICD-10-CM | POA: Diagnosis not present

## 2020-05-23 DIAGNOSIS — S72142D Displaced intertrochanteric fracture of left femur, subsequent encounter for closed fracture with routine healing: Secondary | ICD-10-CM | POA: Diagnosis not present

## 2020-05-23 DIAGNOSIS — M5430 Sciatica, unspecified side: Secondary | ICD-10-CM | POA: Diagnosis not present

## 2020-06-02 DIAGNOSIS — M1990 Unspecified osteoarthritis, unspecified site: Secondary | ICD-10-CM | POA: Diagnosis not present

## 2020-06-02 DIAGNOSIS — G894 Chronic pain syndrome: Secondary | ICD-10-CM | POA: Diagnosis not present

## 2020-06-02 DIAGNOSIS — T148XXD Other injury of unspecified body region, subsequent encounter: Secondary | ICD-10-CM | POA: Diagnosis not present

## 2020-06-06 DIAGNOSIS — S72142D Displaced intertrochanteric fracture of left femur, subsequent encounter for closed fracture with routine healing: Secondary | ICD-10-CM | POA: Diagnosis not present

## 2020-08-18 DIAGNOSIS — M19041 Primary osteoarthritis, right hand: Secondary | ICD-10-CM | POA: Diagnosis not present

## 2020-08-18 DIAGNOSIS — R636 Underweight: Secondary | ICD-10-CM | POA: Diagnosis not present

## 2020-08-18 DIAGNOSIS — Z8719 Personal history of other diseases of the digestive system: Secondary | ICD-10-CM | POA: Diagnosis not present

## 2020-08-18 DIAGNOSIS — R634 Abnormal weight loss: Secondary | ICD-10-CM | POA: Diagnosis not present

## 2020-08-18 DIAGNOSIS — M19042 Primary osteoarthritis, left hand: Secondary | ICD-10-CM | POA: Diagnosis not present

## 2020-08-18 DIAGNOSIS — S72002S Fracture of unspecified part of neck of left femur, sequela: Secondary | ICD-10-CM | POA: Diagnosis not present

## 2020-09-19 DIAGNOSIS — S72142D Displaced intertrochanteric fracture of left femur, subsequent encounter for closed fracture with routine healing: Secondary | ICD-10-CM | POA: Diagnosis not present

## 2020-09-28 DIAGNOSIS — Z23 Encounter for immunization: Secondary | ICD-10-CM | POA: Diagnosis not present

## 2020-11-07 DIAGNOSIS — M19042 Primary osteoarthritis, left hand: Secondary | ICD-10-CM | POA: Diagnosis not present

## 2020-11-07 DIAGNOSIS — Z23 Encounter for immunization: Secondary | ICD-10-CM | POA: Diagnosis not present

## 2020-11-07 DIAGNOSIS — R636 Underweight: Secondary | ICD-10-CM | POA: Diagnosis not present

## 2020-11-07 DIAGNOSIS — Z0000 Encounter for general adult medical examination without abnormal findings: Secondary | ICD-10-CM | POA: Diagnosis not present

## 2020-11-07 DIAGNOSIS — Z136 Encounter for screening for cardiovascular disorders: Secondary | ICD-10-CM | POA: Diagnosis not present

## 2020-11-07 DIAGNOSIS — M19041 Primary osteoarthritis, right hand: Secondary | ICD-10-CM | POA: Diagnosis not present

## 2020-11-07 DIAGNOSIS — M810 Age-related osteoporosis without current pathological fracture: Secondary | ICD-10-CM | POA: Diagnosis not present

## 2020-11-07 DIAGNOSIS — Z13220 Encounter for screening for lipoid disorders: Secondary | ICD-10-CM | POA: Diagnosis not present

## 2020-11-07 DIAGNOSIS — Z1389 Encounter for screening for other disorder: Secondary | ICD-10-CM | POA: Diagnosis not present

## 2020-11-09 ENCOUNTER — Other Ambulatory Visit: Payer: Self-pay | Admitting: Internal Medicine

## 2020-11-09 DIAGNOSIS — M810 Age-related osteoporosis without current pathological fracture: Secondary | ICD-10-CM

## 2020-11-18 DIAGNOSIS — G8929 Other chronic pain: Secondary | ICD-10-CM | POA: Diagnosis not present

## 2020-12-05 DIAGNOSIS — G894 Chronic pain syndrome: Secondary | ICD-10-CM | POA: Diagnosis not present

## 2021-01-30 DIAGNOSIS — R636 Underweight: Secondary | ICD-10-CM | POA: Diagnosis not present

## 2021-01-30 DIAGNOSIS — E559 Vitamin D deficiency, unspecified: Secondary | ICD-10-CM | POA: Diagnosis not present

## 2021-01-30 DIAGNOSIS — M810 Age-related osteoporosis without current pathological fracture: Secondary | ICD-10-CM | POA: Diagnosis not present

## 2021-01-30 DIAGNOSIS — M19042 Primary osteoarthritis, left hand: Secondary | ICD-10-CM | POA: Diagnosis not present

## 2021-01-30 DIAGNOSIS — G894 Chronic pain syndrome: Secondary | ICD-10-CM | POA: Diagnosis not present

## 2021-07-14 DIAGNOSIS — F5101 Primary insomnia: Secondary | ICD-10-CM | POA: Diagnosis not present

## 2021-07-14 DIAGNOSIS — G894 Chronic pain syndrome: Secondary | ICD-10-CM | POA: Diagnosis not present

## 2021-07-14 DIAGNOSIS — L602 Onychogryphosis: Secondary | ICD-10-CM | POA: Diagnosis not present

## 2021-11-10 DIAGNOSIS — Z23 Encounter for immunization: Secondary | ICD-10-CM | POA: Diagnosis not present

## 2021-11-10 DIAGNOSIS — Z0000 Encounter for general adult medical examination without abnormal findings: Secondary | ICD-10-CM | POA: Diagnosis not present

## 2021-11-10 DIAGNOSIS — E46 Unspecified protein-calorie malnutrition: Secondary | ICD-10-CM | POA: Diagnosis not present

## 2021-11-10 DIAGNOSIS — M810 Age-related osteoporosis without current pathological fracture: Secondary | ICD-10-CM | POA: Diagnosis not present

## 2021-11-10 DIAGNOSIS — S72002S Fracture of unspecified part of neck of left femur, sequela: Secondary | ICD-10-CM | POA: Diagnosis not present

## 2021-11-10 DIAGNOSIS — G894 Chronic pain syndrome: Secondary | ICD-10-CM | POA: Diagnosis not present

## 2021-11-10 DIAGNOSIS — Z8719 Personal history of other diseases of the digestive system: Secondary | ICD-10-CM | POA: Diagnosis not present

## 2021-12-06 DIAGNOSIS — M810 Age-related osteoporosis without current pathological fracture: Secondary | ICD-10-CM | POA: Diagnosis not present

## 2021-12-06 DIAGNOSIS — Z780 Asymptomatic menopausal state: Secondary | ICD-10-CM | POA: Diagnosis not present

## 2022-11-12 DIAGNOSIS — E559 Vitamin D deficiency, unspecified: Secondary | ICD-10-CM | POA: Diagnosis not present

## 2022-11-12 DIAGNOSIS — M810 Age-related osteoporosis without current pathological fracture: Secondary | ICD-10-CM | POA: Diagnosis not present

## 2022-11-12 DIAGNOSIS — E46 Unspecified protein-calorie malnutrition: Secondary | ICD-10-CM | POA: Diagnosis not present

## 2022-11-12 DIAGNOSIS — M19042 Primary osteoarthritis, left hand: Secondary | ICD-10-CM | POA: Diagnosis not present

## 2022-11-12 DIAGNOSIS — G894 Chronic pain syndrome: Secondary | ICD-10-CM | POA: Diagnosis not present

## 2022-11-12 DIAGNOSIS — Z0000 Encounter for general adult medical examination without abnormal findings: Secondary | ICD-10-CM | POA: Diagnosis not present

## 2022-11-12 DIAGNOSIS — R54 Age-related physical debility: Secondary | ICD-10-CM | POA: Diagnosis not present

## 2022-11-12 DIAGNOSIS — M19041 Primary osteoarthritis, right hand: Secondary | ICD-10-CM | POA: Diagnosis not present

## 2022-11-12 DIAGNOSIS — E440 Moderate protein-calorie malnutrition: Secondary | ICD-10-CM | POA: Diagnosis not present

## 2022-11-12 DIAGNOSIS — Z23 Encounter for immunization: Secondary | ICD-10-CM | POA: Diagnosis not present

## 2022-11-26 DIAGNOSIS — E46 Unspecified protein-calorie malnutrition: Secondary | ICD-10-CM | POA: Diagnosis not present

## 2022-11-26 DIAGNOSIS — R300 Dysuria: Secondary | ICD-10-CM | POA: Diagnosis not present

## 2023-04-26 DIAGNOSIS — N390 Urinary tract infection, site not specified: Secondary | ICD-10-CM | POA: Diagnosis not present

## 2023-10-11 DIAGNOSIS — R32 Unspecified urinary incontinence: Secondary | ICD-10-CM | POA: Diagnosis not present

## 2023-10-11 DIAGNOSIS — Z556 Problems related to health literacy: Secondary | ICD-10-CM | POA: Diagnosis not present

## 2023-10-11 DIAGNOSIS — M810 Age-related osteoporosis without current pathological fracture: Secondary | ICD-10-CM | POA: Diagnosis not present

## 2023-10-11 DIAGNOSIS — Z79891 Long term (current) use of opiate analgesic: Secondary | ICD-10-CM | POA: Diagnosis not present

## 2023-10-11 DIAGNOSIS — E46 Unspecified protein-calorie malnutrition: Secondary | ICD-10-CM | POA: Diagnosis not present

## 2023-10-11 DIAGNOSIS — G894 Chronic pain syndrome: Secondary | ICD-10-CM | POA: Diagnosis not present

## 2023-10-11 DIAGNOSIS — M19042 Primary osteoarthritis, left hand: Secondary | ICD-10-CM | POA: Diagnosis not present

## 2023-10-11 DIAGNOSIS — F0390 Unspecified dementia without behavioral disturbance: Secondary | ICD-10-CM | POA: Diagnosis not present

## 2023-10-11 DIAGNOSIS — Z96659 Presence of unspecified artificial knee joint: Secondary | ICD-10-CM | POA: Diagnosis not present

## 2023-10-11 DIAGNOSIS — M19041 Primary osteoarthritis, right hand: Secondary | ICD-10-CM | POA: Diagnosis not present

## 2023-11-01 DIAGNOSIS — M179 Osteoarthritis of knee, unspecified: Secondary | ICD-10-CM | POA: Diagnosis not present

## 2023-11-01 DIAGNOSIS — N390 Urinary tract infection, site not specified: Secondary | ICD-10-CM | POA: Diagnosis not present

## 2023-11-01 DIAGNOSIS — W19XXXA Unspecified fall, initial encounter: Secondary | ICD-10-CM | POA: Diagnosis not present

## 2023-11-01 DIAGNOSIS — E440 Moderate protein-calorie malnutrition: Secondary | ICD-10-CM | POA: Diagnosis not present

## 2023-11-06 DIAGNOSIS — N390 Urinary tract infection, site not specified: Secondary | ICD-10-CM | POA: Diagnosis not present
